# Patient Record
Sex: Female | Born: 1975 | Race: White | Hispanic: No | Marital: Single | Smoking: Never smoker
Health system: Southern US, Community
[De-identification: ages and names within clinical notes are randomized; demographics above are authoritative.]

## PROBLEM LIST (undated history)

## (undated) DIAGNOSIS — E7800 Pure hypercholesterolemia, unspecified: Secondary | ICD-10-CM

## (undated) HISTORY — DX: Pure hypercholesterolemia, unspecified: E78.00

## (undated) HISTORY — PX: WISDOM TOOTH EXTRACTION: SHX21

---

## 2009-09-04 ENCOUNTER — Other Ambulatory Visit: Admission: RE | Admit: 2009-09-04 | Discharge: 2009-09-04 | Payer: Self-pay | Admitting: Obstetrics and Gynecology

## 2009-09-04 ENCOUNTER — Encounter: Payer: Self-pay | Admitting: Women's Health

## 2009-09-04 ENCOUNTER — Ambulatory Visit: Payer: Self-pay | Admitting: Women's Health

## 2014-04-05 ENCOUNTER — Encounter: Payer: Self-pay | Admitting: Women's Health

## 2014-04-05 ENCOUNTER — Other Ambulatory Visit (HOSPITAL_COMMUNITY)
Admission: RE | Admit: 2014-04-05 | Discharge: 2014-04-05 | Disposition: A | Discharge status: Home / Self Care | Payer: 59 | Source: Ambulatory Visit | Attending: Gynecology | Admitting: Gynecology

## 2014-04-05 ENCOUNTER — Ambulatory Visit (INDEPENDENT_AMBULATORY_CARE_PROVIDER_SITE_OTHER): Payer: 59 | Admitting: Women's Health

## 2014-04-05 VITALS — BP 124/80 | Ht 68.0 in | Wt 216.0 lb

## 2014-04-05 DIAGNOSIS — Z01419 Encounter for gynecological examination (general) (routine) without abnormal findings: Secondary | ICD-10-CM | POA: Insufficient documentation

## 2014-04-05 DIAGNOSIS — Z113 Encounter for screening for infections with a predominantly sexual mode of transmission: Secondary | ICD-10-CM

## 2014-04-05 DIAGNOSIS — N926 Irregular menstruation, unspecified: Secondary | ICD-10-CM

## 2014-04-05 DIAGNOSIS — Z833 Family history of diabetes mellitus: Secondary | ICD-10-CM

## 2014-04-05 LAB — CBC WITH DIFFERENTIAL/PLATELET
BASOS ABS: 0 10*3/uL (ref 0.0–0.1)
Basophils Relative: 0 % (ref 0–1)
EOS PCT: 2 % (ref 0–5)
Eosinophils Absolute: 0.2 10*3/uL (ref 0.0–0.7)
HEMATOCRIT: 39 % (ref 36.0–46.0)
HEMOGLOBIN: 12.8 g/dL (ref 12.0–15.0)
LYMPHS ABS: 2.5 10*3/uL (ref 0.7–4.0)
LYMPHS PCT: 31 % (ref 12–46)
MCH: 26.6 pg (ref 26.0–34.0)
MCHC: 32.8 g/dL (ref 30.0–36.0)
MCV: 81.1 fL (ref 78.0–100.0)
MONO ABS: 0.6 10*3/uL (ref 0.1–1.0)
MONOS PCT: 7 % (ref 3–12)
Neutro Abs: 4.8 10*3/uL (ref 1.7–7.7)
Neutrophils Relative %: 60 % (ref 43–77)
Platelets: 360 10*3/uL (ref 150–400)
RBC: 4.81 MIL/uL (ref 3.87–5.11)
RDW: 14.4 % (ref 11.5–15.5)
WBC: 8 10*3/uL (ref 4.0–10.5)

## 2014-04-05 MED ORDER — NORGESTIMATE-ETH ESTRADIOL 0.25-35 MG-MCG PO TABS: 1.0000 | ORAL_TABLET | Freq: Every day | ORAL | Status: DC

## 2014-04-05 NOTE — Patient Instructions (Signed)

## 2014-04-05 NOTE — Progress Notes (Signed)
Wendy Barrera 09-11-1976 [id number]    History:    Presents for annual exam.  Irregular periods, 4 -5 per year. Always irregular cycles, normal TSH and prolactin, regular when on OC's. Last pap normal 2010. Not sexually active but has had sexual partner since last visit.  Past medical history, past surgical history, family history and social history were all reviewed and documented in the EPIC chart. Mother- multiple precancerous breast biopsies at 1560, tamoxifen for 5 years. Works for [employer].   ROS:  A  ROS was performed and pertinent positives and negatives are included.  Exam:  Filed Vitals:   04/05/14 1516  BP: 124/80    General appearance:  Normal, overweight, appears well Thyroid:  Symmetrical, normal in size, without palpable masses or nodularity. Respiratory  Auscultation:  Clear without wheezing or rhonchi Cardiovascular  Auscultation:  Regular rate, without rubs, murmurs or gallops  Edema/varicosities:  Not grossly evident Abdominal  Soft,nontender, without masses, guarding or rebound.  Liver/spleen:  No organomegaly noted  Hernia:  None appreciated  Skin  Inspection:  Grossly normal, scar from irritation on L hand   Breasts: Examined lying and sitting.     Right: Without masses, retractions, discharge or axillary adenopathy.     Left: Without masses, retractions, discharge or axillary adenopathy. Gentitourinary   Inguinal/mons:  Normal without inguinal adenopathy  External genitalia:  Normal  BUS/Urethra/Skene's glands:  Normal  Vagina:  Normal  Cervix:  Normal  Uterus:  normal in size, shape and contour.  Midline and mobile  Adnexa/parametria:     Rt: Without masses or tenderness.   Lt: Without masses or tenderness.  Anus and perineum: Normal  Assessment/Plan:  38 y.o.  for annual exam. SWF G0P0.   Irregular menses Obesity STD screen  Plan: Ortho-Cyclen prescription, proper use given and reviewed. Discussed risk of stroke and clots. Encouraged  condom use . SBE's, increase regular exercise and decrease calories for weight loss, calcium rich diet, MVI daily encouraged. CBC, glucose, UA, Pap, GC/Chlamydia, HIV, hep B, C., RPR.  Harrington Challengerancy J Cristabel Bicknell [facility] Center[facility]NP, 3:52 PM 04/05/2014

## 2014-04-06 LAB — URINALYSIS W MICROSCOPIC + REFLEX CULTURE
BILIRUBIN URINE: NEGATIVE
Bacteria, UA: NONE SEEN
CASTS: NONE SEEN
CRYSTALS: NONE SEEN
GLUCOSE, UA: NEGATIVE mg/dL
Hgb urine dipstick: NEGATIVE
Ketones, ur: NEGATIVE mg/dL
Nitrite: NEGATIVE
PH: 6.5 (ref 5.0–8.0)
Protein, ur: NEGATIVE mg/dL
SPECIFIC GRAVITY, URINE: 1.009 (ref 1.005–1.030)
SQUAMOUS EPITHELIAL / LPF: NONE SEEN
Urobilinogen, UA: 0.2 mg/dL (ref 0.0–1.0)

## 2014-04-06 LAB — HEPATITIS B SURFACE ANTIGEN: Hepatitis B Surface Ag: NEGATIVE

## 2014-04-06 LAB — RPR

## 2014-04-06 LAB — HIV ANTIBODY (ROUTINE TESTING W REFLEX): HIV: NONREACTIVE

## 2014-04-06 LAB — HEPATITIS C ANTIBODY: HCV Ab: NEGATIVE

## 2014-04-06 LAB — GLUCOSE, RANDOM: Glucose, Bld: 86 mg/dL (ref 70–99)

## 2014-04-09 LAB — URINE CULTURE: Colony Count: >=100000

## 2014-04-09 LAB — GC/CHLAMYDIA PROBE AMP
CT Probe RNA: NEGATIVE
GC Probe RNA: NEGATIVE

## 2014-04-10 ENCOUNTER — Other Ambulatory Visit: Payer: Self-pay | Admitting: Women's Health

## 2014-04-10 MED ORDER — NITROFURANTOIN MONOHYD MACRO 100 MG PO CAPS: 100.0000 mg | ORAL_CAPSULE | Freq: Two times a day (BID) | ORAL | Status: DC

## 2014-05-24 ENCOUNTER — Other Ambulatory Visit: Payer: Self-pay | Admitting: *Deleted

## 2014-05-24 DIAGNOSIS — N926 Irregular menstruation, unspecified: Secondary | ICD-10-CM

## 2014-05-24 MED ORDER — NORGESTIMATE-ETH ESTRADIOL 0.25-35 MG-MCG PO TABS: 1.0000 | ORAL_TABLET | Freq: Every day | ORAL | Status: DC

## 2014-05-24 NOTE — Telephone Encounter (Signed)
Pharmacy faxed refill request for BCP pt was seen on 03/2014

## 2015-06-11 ENCOUNTER — Encounter: Payer: Self-pay | Admitting: Women's Health

## 2015-06-11 ENCOUNTER — Ambulatory Visit (INDEPENDENT_AMBULATORY_CARE_PROVIDER_SITE_OTHER): Payer: 59 | Admitting: Women's Health

## 2015-06-11 VITALS — BP 120/82 | Ht 69.0 in | Wt 196.2 lb

## 2015-06-11 DIAGNOSIS — Z13220 Encounter for screening for lipoid disorders: Secondary | ICD-10-CM

## 2015-06-11 DIAGNOSIS — Z01419 Encounter for gynecological examination (general) (routine) without abnormal findings: Secondary | ICD-10-CM | POA: Diagnosis not present

## 2015-06-11 DIAGNOSIS — N926 Irregular menstruation, unspecified: Secondary | ICD-10-CM | POA: Diagnosis not present

## 2015-06-11 LAB — LIPID PANEL
Cholesterol: 224 mg/dL — ABNORMAL HIGH (ref 0–200)
HDL: 41 mg/dL — ABNORMAL LOW (ref >=46)
LDL Cholesterol: 125 mg/dL — ABNORMAL HIGH (ref 0–99)
Total CHOL/HDL Ratio: 5.5 Ratio
Triglycerides: 288 mg/dL — ABNORMAL HIGH (ref <150)
VLDL: 58 mg/dL — AB (ref 0–40)

## 2015-06-11 LAB — CBC WITH DIFFERENTIAL/PLATELET
BASOS ABS: 0.1 10*3/uL (ref 0.0–0.1)
Basophils Relative: 1 % (ref 0–1)
Eosinophils Absolute: 0.2 10*3/uL (ref 0.0–0.7)
Eosinophils Relative: 2 % (ref 0–5)
HCT: 40.4 % (ref 36.0–46.0)
HEMOGLOBIN: 13.3 g/dL (ref 12.0–15.0)
LYMPHS ABS: 2.2 10*3/uL (ref 0.7–4.0)
LYMPHS PCT: 27 % (ref 12–46)
MCH: 27.8 pg (ref 26.0–34.0)
MCHC: 32.9 g/dL (ref 30.0–36.0)
MCV: 84.3 fL (ref 78.0–100.0)
MONO ABS: 0.6 10*3/uL (ref 0.1–1.0)
MPV: 9.4 fL (ref 8.6–12.4)
Monocytes Relative: 7 % (ref 3–12)
Neutro Abs: 5.2 10*3/uL (ref 1.7–7.7)
Neutrophils Relative %: 63 % (ref 43–77)
PLATELETS: 360 10*3/uL (ref 150–400)
RBC: 4.79 MIL/uL (ref 3.87–5.11)
RDW: 13.7 % (ref 11.5–15.5)
WBC: 8.2 10*3/uL (ref 4.0–10.5)

## 2015-06-11 LAB — GLUCOSE, RANDOM: Glucose, Bld: 86 mg/dL (ref 70–99)

## 2015-06-11 MED ORDER — NORGESTIMATE-ETH ESTRADIOL 0.25-35 MG-MCG PO TABS: 1.0000 | ORAL_TABLET | Freq: Every day | ORAL | Status: DC

## 2015-06-11 NOTE — Patient Instructions (Signed)

## 2015-06-11 NOTE — Progress Notes (Signed)
Wendy Barrera 09-07-1976 [id number]    History:    Presents for annual exam.  Regular monthly cycle on Ortho-Cyclen. History of cycle every other month. Normal TSH and prolactin. Not sexually active greater than one year. Normal Pap history. Has lost 20 pounds in the past year with diet and exercise.  Past medical history, past surgical history, family history and social history were all reviewed and documented in the EPIC chart. Works for [employer]. Mother questionable precancerous breast cells on biopsy tamoxifen for 5 years. A. fib. Father recent cholecystectomy.  ROS:  A ROS was performed and pertinent positives and negatives are included.  Exam:  Filed Vitals:   06/11/15 1525  BP: 120/82    General appearance:  Normal Thyroid:  Symmetrical, normal in size, without palpable masses or nodularity. Respiratory  Auscultation:  Clear without wheezing or rhonchi Cardiovascular  Auscultation:  Regular rate, without rubs, murmurs or gallops  Edema/varicosities:  Not grossly evident Abdominal  Soft,nontender, without masses, guarding or rebound.  Liver/spleen:  No organomegaly noted  Hernia:  None appreciated  Skin  Inspection:  Grossly normal   Breasts: Examined lying and sitting.     Right: Without masses, retractions, discharge or axillary adenopathy.     Left: Without masses, retractions, discharge or axillary adenopathy. Gentitourinary   Inguinal/mons:  Normal without inguinal adenopathy  External genitalia:  Normal  BUS/Urethra/Skene's glands:  Normal  Vagina:  Normal  Cervix:  Normal  Uterus:   normal in size, shape and contour.  Midline and mobile  Adnexa/parametria:     Rt: Without masses or tenderness.   Lt: Without masses or tenderness.  Anus and perineum: Normal  Digital rectal exam: Normal sphincter tone without palpated masses or tenderness  Assessment/Plan:  38 y.o.SWF G0  for annual exam.    Regular monthly cycle on Ortho-Cyclen/history of irregular  cycles  Plan: Congratulated on a 20 pound weight loss is continuing to try to increase regular exercise and decrease carbs need a healthy diet. SBE's, annual screening mammogram at 40, calcium rich diet, MVI daily.  Ortho-Cyclen prescription, proper use, risk for blood clots and strokes reviewed. Condoms encouraged if sexually active. CBC, glucose, lipid panel, UA, Pap normal 2015, new screening guidelines reviewed.   Harrington Challenger [facility], 3:57 PM 06/11/2015

## 2015-06-13 ENCOUNTER — Other Ambulatory Visit: Payer: Self-pay | Admitting: Women's Health

## 2015-06-13 DIAGNOSIS — E782 Mixed hyperlipidemia: Secondary | ICD-10-CM

## 2015-06-13 DIAGNOSIS — E7800 Pure hypercholesterolemia, unspecified: Secondary | ICD-10-CM

## 2015-10-13 ENCOUNTER — Other Ambulatory Visit: Payer: 59

## 2015-10-14 ENCOUNTER — Other Ambulatory Visit: Payer: 59

## 2015-10-14 DIAGNOSIS — E782 Mixed hyperlipidemia: Secondary | ICD-10-CM

## 2015-10-14 DIAGNOSIS — E7800 Pure hypercholesterolemia, unspecified: Secondary | ICD-10-CM

## 2015-10-14 LAB — LIPID PANEL
CHOL/HDL RATIO: 5.5 ratio — AB (ref <=5.0)
Cholesterol: 177 mg/dL (ref 125–200)
HDL: 32 mg/dL — AB (ref >=46)
LDL CALC: 70 mg/dL (ref <130)
Triglycerides: 377 mg/dL — ABNORMAL HIGH (ref <150)
VLDL: 75 mg/dL — AB (ref <30)

## 2015-10-16 ENCOUNTER — Encounter: Payer: Self-pay | Admitting: Women's Health

## 2016-06-11 ENCOUNTER — Encounter: Payer: 59 | Admitting: Women's Health

## 2016-07-02 ENCOUNTER — Encounter: Payer: Self-pay | Admitting: Women's Health

## 2016-07-02 ENCOUNTER — Ambulatory Visit (INDEPENDENT_AMBULATORY_CARE_PROVIDER_SITE_OTHER): Payer: 59 | Admitting: Women's Health

## 2016-07-02 VITALS — BP 118/76 | Ht 68.0 in | Wt 185.4 lb

## 2016-07-02 DIAGNOSIS — Z1151 Encounter for screening for human papillomavirus (HPV): Secondary | ICD-10-CM

## 2016-07-02 DIAGNOSIS — N926 Irregular menstruation, unspecified: Secondary | ICD-10-CM

## 2016-07-02 DIAGNOSIS — E7800 Pure hypercholesterolemia, unspecified: Secondary | ICD-10-CM | POA: Diagnosis not present

## 2016-07-02 DIAGNOSIS — Z01419 Encounter for gynecological examination (general) (routine) without abnormal findings: Secondary | ICD-10-CM | POA: Diagnosis not present

## 2016-07-02 MED ORDER — NORGESTIMATE-ETH ESTRADIOL 0.25-35 MG-MCG PO TABS
1.0000 | ORAL_TABLET | Freq: Every day | ORAL | Status: DC
Start: 2016-07-02 — End: 2016-08-02

## 2016-07-02 NOTE — Progress Notes (Signed)
Wendy Barrera [birth year] [id number]    History:    Presents for annual exam.  Monthly cycle on Ortho-Cyclen without complaint. Not sexually active greater than a year. Normal Pap history. Elevated lipid panel last year, primary care  started on Crestor lipid panel much better. Mother history of questionable precancerous cells on breast biopsies was on tamoxifen for 5 years doing well.  Past medical history, past surgical history, family history and social history were all reviewed and documented in the EPIC chart. [place] at [employer]. Father A. fib.  ROS:  A ROS was performed and pertinent positives and negatives are included.  Exam:  Filed Vitals:   07/02/16 0753  BP: 118/76    General appearance:  Normal Thyroid:  Symmetrical, normal in size, without palpable masses or nodularity. Respiratory  Auscultation:  Clear without wheezing or rhonchi Cardiovascular  Auscultation:  Regular rate, without rubs, murmurs or gallops  Edema/varicosities:  Not grossly evident Abdominal  Soft,nontender, without masses, guarding or rebound.  Liver/spleen:  No organomegaly noted  Hernia:  None appreciated  Skin  Inspection:  Grossly normal   Breasts: Examined lying and sitting.     Right: Without masses, retractions, discharge or axillary adenopathy.     Left: Without masses, retractions, discharge or axillary adenopathy. Gentitourinary   Inguinal/mons:  Normal without inguinal adenopathy  External genitalia:  Normal  BUS/Urethra/Skene's glands:  Normal  Vagina:  Normal  Cervix:  Normal  Uterus:  normal in size, shape and contour.  Midline and mobile  Adnexa/parametria:     Rt: Without masses or tenderness.   Lt: Without masses or tenderness.  Anus and perineum: Normal  Digital rectal exam: Normal sphincter tone without palpated masses or tenderness  Assessment/Plan:  40 y.o. S WF G0  for annual exam with no complaints.  Monthly cycle on  Ortho-Cyclen Hypercholesteremia-primary care manages labs and meds  Plan: Ortho-Cyclen prescription, proper use, slight risk for blood clots and strokes reviewed. Condoms encouraged if sexually active. Continue healthy lifestyle has lost 11 pounds since last year. Encouraged exercise, calcium rich diet, MVI daily. Reviewed importance of stopping Crestor prior to trying to conceive. UA, Pap with HR HPV typing, new screening guidelines reviewed.  Harrington ChallengerYOUNG,NANCY J [facility]WHNP, 8:26 AM 07/02/2016

## 2016-07-02 NOTE — Patient Instructions (Signed)
Health Maintenance, Female Adopting a healthy lifestyle and getting preventive care can go a long way to promote health and wellness. Talk with your health care provider about what schedule of regular examinations is right for you. This is a good chance for you to check in with your provider about disease prevention and staying healthy. In between checkups, there are plenty of things you can do on your own. Experts have done a lot of research about which lifestyle changes and preventive measures are most likely to keep you healthy. Ask your health care provider for more information. WEIGHT AND DIET  Eat a healthy diet  Be sure to include plenty of vegetables, fruits, low-fat dairy products, and lean protein.  Do not eat a lot of foods high in solid fats, added sugars, or salt.  Get regular exercise. This is one of the most important things you can do for your health.  Most adults should exercise for at least 150 minutes each week. The exercise should increase your heart rate and make you sweat (moderate-intensity exercise).  Most adults should also do strengthening exercises at least twice a week. This is in addition to the moderate-intensity exercise.  Maintain a healthy weight  Body mass index (BMI) is a measurement that can be used to identify possible weight problems. It estimates body fat based on height and weight. Your health care provider can help determine your BMI and help you achieve or maintain a healthy weight.  For females 20 years of age and older:   A BMI below 18.5 is considered underweight.  A BMI of 18.5 to 24.9 is normal.  A BMI of 25 to 29.9 is considered overweight.  A BMI of 30 and above is considered obese.  Watch levels of cholesterol and blood lipids  You should start having your blood tested for lipids and cholesterol at 39 years of age, then have this test every 5 years.  You may need to have your cholesterol levels checked more often if:  Your lipid  or cholesterol levels are high.  You are older than 39 years of age.  You are at high risk for heart disease.  CANCER SCREENING   Lung Cancer  Lung cancer screening is recommended for adults 55-80 years old who are at high risk for lung cancer because of a history of smoking.  A yearly low-dose CT scan of the lungs is recommended for people who:  Currently smoke.  Have quit within the past 15 years.  Have at least a 30-pack-year history of smoking. A pack year is smoking an average of one pack of cigarettes a day for 1 year.  Yearly screening should continue until it has been 15 years since you quit.  Yearly screening should stop if you develop a health problem that would prevent you from having lung cancer treatment.  Breast Cancer  Practice breast self-awareness. This means understanding how your breasts normally appear and feel.  It also means doing regular breast self-exams. Let your health care provider know about any changes, no matter how small.  If you are in your 20s or 30s, you should have a clinical breast exam (CBE) by a health care provider every 1-3 years as part of a regular health exam.  If you are 40 or older, have a CBE every year. Also consider having a breast X-ray (mammogram) every year.  If you have a family history of breast cancer, talk to your health care provider about genetic screening.  If you   are at high risk for breast cancer, talk to your health care provider about having an MRI and a mammogram every year.  Breast cancer gene (BRCA) assessment is recommended for women who have family members with BRCA-related cancers. BRCA-related cancers include:  Breast.  Ovarian.  Tubal.  Peritoneal cancers.  Results of the assessment will determine the need for genetic counseling and BRCA1 and BRCA2 testing. Cervical Cancer Your health care provider may recommend that you be screened regularly for cancer of the pelvic organs (ovaries, uterus, and  vagina). This screening involves a pelvic examination, including checking for microscopic changes to the surface of your cervix (Pap test). You may be encouraged to have this screening done every 3 years, beginning at age 21.  For women ages 30-65, health care providers may recommend pelvic exams and Pap testing every 3 years, or they may recommend the Pap and pelvic exam, combined with testing for human papilloma virus (HPV), every 5 years. Some types of HPV increase your risk of cervical cancer. Testing for HPV may also be done on women of any age with unclear Pap test results.  Other health care providers may not recommend any screening for nonpregnant women who are considered low risk for pelvic cancer and who do not have symptoms. Ask your health care provider if a screening pelvic exam is right for you.  If you have had past treatment for cervical cancer or a condition that could lead to cancer, you need Pap tests and screening for cancer for at least 20 years after your treatment. If Pap tests have been discontinued, your risk factors (such as having a new sexual partner) need to be reassessed to determine if screening should resume. Some women have medical problems that increase the chance of getting cervical cancer. In these cases, your health care provider may recommend more frequent screening and Pap tests. Colorectal Cancer  This type of cancer can be detected and often prevented.  Routine colorectal cancer screening usually begins at 39 years of age and continues through 39 years of age.  Your health care provider may recommend screening at an earlier age if you have risk factors for colon cancer.  Your health care provider may also recommend using home test kits to check for hidden blood in the stool.  A small camera at the end of a tube can be used to examine your colon directly (sigmoidoscopy or colonoscopy). This is done to check for the earliest forms of colorectal  cancer.  Routine screening usually begins at age 50.  Direct examination of the colon should be repeated every 5-10 years through 39 years of age. However, you may need to be screened more often if early forms of precancerous polyps or small growths are found. Skin Cancer  Check your skin from head to toe regularly.  Tell your health care provider about any new moles or changes in moles, especially if there is a change in a mole's shape or color.  Also tell your health care provider if you have a mole that is larger than the size of a pencil eraser.  Always use sunscreen. Apply sunscreen liberally and repeatedly throughout the day.  Protect yourself by wearing long sleeves, pants, a wide-brimmed hat, and sunglasses whenever you are outside. HEART DISEASE, DIABETES, AND HIGH BLOOD PRESSURE   High blood pressure causes heart disease and increases the risk of stroke. High blood pressure is more likely to develop in:  People who have blood pressure in the high end   of the normal range (130-139/85-89 mm Hg).  People who are overweight or obese.  People who are African American.  If you are 38-23 years of age, have your blood pressure checked every 3-5 years. If you are 61 years of age or older, have your blood pressure checked every year. You should have your blood pressure measured twice--once when you are at a hospital or clinic, and once when you are not at a hospital or clinic. Record the average of the two measurements. To check your blood pressure when you are not at a hospital or clinic, you can use:  An automated blood pressure machine at a pharmacy.  A home blood pressure monitor.  If you are between 45 years and 39 years old, ask your health care provider if you should take aspirin to prevent strokes.  Have regular diabetes screenings. This involves taking a blood sample to check your fasting blood sugar level.  If you are at a normal weight and have a low risk for diabetes,  have this test once every three years after 39 years of age.  If you are overweight and have a high risk for diabetes, consider being tested at a younger age or more often. PREVENTING INFECTION  Hepatitis B  If you have a higher risk for hepatitis B, you should be screened for this virus. You are considered at high risk for hepatitis B if:  You were born in a country where hepatitis B is common. Ask your health care provider which countries are considered high risk.  Your parents were born in a high-risk country, and you have not been immunized against hepatitis B (hepatitis B vaccine).  You have HIV or AIDS.  You use needles to inject street drugs.  You live with someone who has hepatitis B.  You have had sex with someone who has hepatitis B.  You get hemodialysis treatment.  You take certain medicines for conditions, including cancer, organ transplantation, and autoimmune conditions. Hepatitis C  Blood testing is recommended for:  Everyone born from [birth year] through 1964.  Anyone with known risk factors for hepatitis C. Sexually transmitted infections (STIs)  You should be screened for sexually transmitted infections (STIs) including gonorrhea and chlamydia if:  You are sexually active and are younger than 39 years of age.  You are older than 39 years of age and your health care provider tells you that you are at risk for this type of infection.  Your sexual activity has changed since you were last screened and you are at an increased risk for chlamydia or gonorrhea. Ask your health care provider if you are at risk.  If you do not have HIV, but are at risk, it may be recommended that you take a prescription medicine daily to prevent HIV infection. This is called pre-exposure prophylaxis (PrEP). You are considered at risk if:  You are sexually active and do not regularly use condoms or know the HIV status of your partner(s).  You take drugs by injection.  You are sexually  active with a partner who has HIV. Talk with your health care provider about whether you are at high risk of being infected with HIV. If you choose to begin PrEP, you should first be tested for HIV. You should then be tested every 3 months for as long as you are taking PrEP.  PREGNANCY   If you are premenopausal and you may become pregnant, ask your health care provider about preconception counseling.  If you may  become pregnant, take 400 to 800 micrograms (mcg) of folic acid every day.  If you want to prevent pregnancy, talk to your health care provider about birth control (contraception). OSTEOPOROSIS AND MENOPAUSE   Osteoporosis is a disease in which the bones lose minerals and strength with aging. This can result in serious bone fractures. Your risk for osteoporosis can be identified using a bone density scan.  If you are 61 years of age or older, or if you are at risk for osteoporosis and fractures, ask your health care provider if you should be screened.  Ask your health care provider whether you should take a calcium or vitamin D supplement to lower your risk for osteoporosis.  Menopause may have certain physical symptoms and risks.  Hormone replacement therapy may reduce some of these symptoms and risks. Talk to your health care provider about whether hormone replacement therapy is right for you.  HOME CARE INSTRUCTIONS   Schedule regular health, dental, and eye exams.  Stay current with your immunizations.   Do not use any tobacco products including cigarettes, chewing tobacco, or electronic cigarettes.  If you are pregnant, do not drink alcohol.  If you are breastfeeding, limit how much and how often you drink alcohol.  Limit alcohol intake to no more than 1 drink per day for nonpregnant women. One drink equals 12 ounces of beer, 5 ounces of wine, or 1 ounces of hard liquor.  Do not use street drugs.  Do not share needles.  Ask your health care provider for help if  you need support or information about quitting drugs.  Tell your health care provider if you often feel depressed.  Tell your health care provider if you have ever been abused or do not feel safe at home.   This information is not intended to replace advice given to you by your health care provider. Make sure you discuss any questions you have with your health care provider.   Document Released: 06/28/2011 Document Revised: 01/03/2015 Document Reviewed: 11/14/2013 Elsevier Interactive Patient Education [employer].

## 2016-07-02 NOTE — Addendum Note (Signed)
Addended by: Richardson ChiquitoWILKINSON, KARI S on: 07/02/2016 08:56 AM   Modules accepted: Orders

## 2016-07-07 LAB — PAP IG AND HPV HIGH-RISK: HPV DNA HIGH RISK: NOT DETECTED

## 2016-08-01 ENCOUNTER — Other Ambulatory Visit: Payer: Self-pay | Admitting: Women's Health

## 2016-08-01 DIAGNOSIS — N926 Irregular menstruation, unspecified: Secondary | ICD-10-CM

## 2017-05-11 ENCOUNTER — Encounter: Payer: Self-pay | Admitting: Gynecology

## 2017-07-06 ENCOUNTER — Encounter: Payer: 59 | Admitting: Women's Health

## 2017-07-11 ENCOUNTER — Encounter: Payer: Self-pay | Admitting: Women's Health

## 2017-07-11 ENCOUNTER — Ambulatory Visit (INDEPENDENT_AMBULATORY_CARE_PROVIDER_SITE_OTHER): Payer: BLUE CROSS/BLUE SHIELD | Admitting: Women's Health

## 2017-07-11 VITALS — BP 118/78 | Ht 69.0 in | Wt 209.0 lb

## 2017-07-11 DIAGNOSIS — N926 Irregular menstruation, unspecified: Secondary | ICD-10-CM | POA: Diagnosis not present

## 2017-07-11 DIAGNOSIS — Z01419 Encounter for gynecological examination (general) (routine) without abnormal findings: Secondary | ICD-10-CM

## 2017-07-11 MED ORDER — NORGESTIMATE-ETH ESTRADIOL 0.25-35 MG-MCG PO TABS: 1.0000 | ORAL_TABLET | Freq: Every day | ORAL | 4 refills | Status: DC

## 2017-07-11 NOTE — Patient Instructions (Signed)
Breast center  [phone]  Mammogram  Health Maintenance, Female Adopting a healthy lifestyle and getting preventive care can go a long way to promote health and wellness. Talk with your health care provider about what schedule of regular examinations is right for you. This is a good chance for you to check in with your provider about disease prevention and staying healthy. In between checkups, there are plenty of things you can do on your own. Experts have done a lot of research about which lifestyle changes and preventive measures are most likely to keep you healthy. Ask your health care provider for more information. Weight and diet Eat a healthy diet  Be sure to include plenty of vegetables, fruits, low-fat dairy products, and lean protein.  Do not eat a lot of foods high in solid fats, added sugars, or salt.  Get regular exercise. This is one of the most important things you can do for your health. ? Most adults should exercise for at least 150 minutes each week. The exercise should increase your heart rate and make you sweat (moderate-intensity exercise). ? Most adults should also do strengthening exercises at least twice a week. This is in addition to the moderate-intensity exercise.  Maintain a healthy weight  Body mass index (BMI) is a measurement that can be used to identify possible weight problems. It estimates body fat based on height and weight. Your health care provider can help determine your BMI and help you achieve or maintain a healthy weight.  For females 20 years of age and older: ? A BMI below 18.5 is considered underweight. ? A BMI of 18.5 to 24.9 is normal. ? A BMI of 25 to 29.9 is considered overweight. ? A BMI of 30 and above is considered obese.  Watch levels of cholesterol and blood lipids  You should start having your blood tested for lipids and cholesterol at 40 years of age, then have this test every 5 years.  You may need to have your cholesterol levels  checked more often if: ? Your lipid or cholesterol levels are high. ? You are older than 40 years of age. ? You are at high risk for heart disease.  Cancer screening Lung Cancer  Lung cancer screening is recommended for adults 55-80 years old who are at high risk for lung cancer because of a history of smoking.  A yearly low-dose CT scan of the lungs is recommended for people who: ? Currently smoke. ? Have quit within the past 15 years. ? Have at least a 30-pack-year history of smoking. A pack year is smoking an average of one pack of cigarettes a day for 1 year.  Yearly screening should continue until it has been 15 years since you quit.  Yearly screening should stop if you develop a health problem that would prevent you from having lung cancer treatment.  Breast Cancer  Practice breast self-awareness. This means understanding how your breasts normally appear and feel.  It also means doing regular breast self-exams. Let your health care provider know about any changes, no matter how small.  If you are in your 20s or 30s, you should have a clinical breast exam (CBE) by a health care provider every 1-3 years as part of a regular health exam.  If you are 40 or older, have a CBE every year. Also consider having a breast X-ray (mammogram) every year.  If you have a family history of breast cancer, talk to your health care provider about genetic screening.    If you are at high risk for breast cancer, talk to your health care provider about having an MRI and a mammogram every year.  Breast cancer gene (BRCA) assessment is recommended for women who have family members with BRCA-related cancers. BRCA-related cancers include: ? Breast. ? Ovarian. ? Tubal. ? Peritoneal cancers.  Results of the assessment will determine the need for genetic counseling and BRCA1 and BRCA2 testing.  Cervical Cancer Your health care provider may recommend that you be screened regularly for cancer of the  pelvic organs (ovaries, uterus, and vagina). This screening involves a pelvic examination, including checking for microscopic changes to the surface of your cervix (Pap test). You may be encouraged to have this screening done every 3 years, beginning at age 71.  For women ages 82-65, health care providers may recommend pelvic exams and Pap testing every 3 years, or they may recommend the Pap and pelvic exam, combined with testing for human papilloma virus (HPV), every 5 years. Some types of HPV increase your risk of cervical cancer. Testing for HPV may also be done on women of any age with unclear Pap test results.  Other health care providers may not recommend any screening for nonpregnant women who are considered low risk for pelvic cancer and who do not have symptoms. Ask your health care provider if a screening pelvic exam is right for you.  If you have had past treatment for cervical cancer or a condition that could lead to cancer, you need Pap tests and screening for cancer for at least 20 years after your treatment. If Pap tests have been discontinued, your risk factors (such as having a new sexual partner) need to be reassessed to determine if screening should resume. Some women have medical problems that increase the chance of getting cervical cancer. In these cases, your health care provider may recommend more frequent screening and Pap tests.  Colorectal Cancer  This type of cancer can be detected and often prevented.  Routine colorectal cancer screening usually begins at 40 years of age and continues through 40 years of age.  Your health care provider may recommend screening at an earlier age if you have risk factors for colon cancer.  Your health care provider may also recommend using home test kits to check for hidden blood in the stool.  A small camera at the end of a tube can be used to examine your colon directly (sigmoidoscopy or colonoscopy). This is done to check for the  earliest forms of colorectal cancer.  Routine screening usually begins at age 15.  Direct examination of the colon should be repeated every 5-10 years through 40 years of age. However, you may need to be screened more often if early forms of precancerous polyps or small growths are found.  Skin Cancer  Check your skin from head to toe regularly.  Tell your health care provider about any new moles or changes in moles, especially if there is a change in a mole's shape or color.  Also tell your health care provider if you have a mole that is larger than the size of a pencil eraser.  Always use sunscreen. Apply sunscreen liberally and repeatedly throughout the day.  Protect yourself by wearing long sleeves, pants, a wide-brimmed hat, and sunglasses whenever you are outside.  Heart disease, diabetes, and high blood pressure  High blood pressure causes heart disease and increases the risk of stroke. High blood pressure is more likely to develop in: ? People who have blood  pressure in the high end of the normal range (130-139/85-89 mm Hg). ? People who are overweight or obese. ? People who are African American.  If you are 56-87 years of age, have your blood pressure checked every 3-5 years. If you are 74 years of age or older, have your blood pressure checked every year. You should have your blood pressure measured twice-once when you are at a hospital or clinic, and once when you are not at a hospital or clinic. Record the average of the two measurements. To check your blood pressure when you are not at a hospital or clinic, you can use: ? An automated blood pressure machine at a pharmacy. ? A home blood pressure monitor.  If you are between 69 years and 79 years old, ask your health care provider if you should take aspirin to prevent strokes.  Have regular diabetes screenings. This involves taking a blood sample to check your fasting blood sugar level. ? If you are at a normal weight and  have a low risk for diabetes, have this test once every three years after 40 years of age. ? If you are overweight and have a high risk for diabetes, consider being tested at a younger age or more often. Preventing infection Hepatitis B  If you have a higher risk for hepatitis B, you should be screened for this virus. You are considered at high risk for hepatitis B if: ? You were born in a country where hepatitis B is common. Ask your health care provider which countries are considered high risk. ? Your parents were born in a high-risk country, and you have not been immunized against hepatitis B (hepatitis B vaccine). ? You have HIV or AIDS. ? You use needles to inject street drugs. ? You live with someone who has hepatitis B. ? You have had sex with someone who has hepatitis B. ? You get hemodialysis treatment. ? You take certain medicines for conditions, including cancer, organ transplantation, and autoimmune conditions.  Hepatitis C  Blood testing is recommended for: ? Everyone born from [birth year] through 1964. ? Anyone with known risk factors for hepatitis C.  Sexually transmitted infections (STIs)  You should be screened for sexually transmitted infections (STIs) including gonorrhea and chlamydia if: ? You are sexually active and are younger than 40 years of age. ? You are older than 40 years of age and your health care provider tells you that you are at risk for this type of infection. ? Your sexual activity has changed since you were last screened and you are at an increased risk for chlamydia or gonorrhea. Ask your health care provider if you are at risk.  If you do not have HIV, but are at risk, it may be recommended that you take a prescription medicine daily to prevent HIV infection. This is called pre-exposure prophylaxis (PrEP). You are considered at risk if: ? You are sexually active and do not regularly use condoms or know the HIV status of your partner(s). ? You take drugs by  injection. ? You are sexually active with a partner who has HIV.  Talk with your health care provider about whether you are at high risk of being infected with HIV. If you choose to begin PrEP, you should first be tested for HIV. You should then be tested every 3 months for as long as you are taking PrEP. Pregnancy  If you are premenopausal and you may become pregnant, ask your health care provider about preconception  counseling.  If you may become pregnant, take 400 to 800 micrograms (mcg) of folic acid every day.  If you want to prevent pregnancy, talk to your health care provider about birth control (contraception). Osteoporosis and menopause  Osteoporosis is a disease in which the bones lose minerals and strength with aging. This can result in serious bone fractures. Your risk for osteoporosis can be identified using a bone density scan.  If you are 54 years of age or older, or if you are at risk for osteoporosis and fractures, ask your health care provider if you should be screened.  Ask your health care provider whether you should take a calcium or vitamin D supplement to lower your risk for osteoporosis.  Menopause may have certain physical symptoms and risks.  Hormone replacement therapy may reduce some of these symptoms and risks. Talk to your health care provider about whether hormone replacement therapy is right for you. Follow these instructions at home:  Schedule regular health, dental, and eye exams.  Stay current with your immunizations.  Do not use any tobacco products including cigarettes, chewing tobacco, or electronic cigarettes.  If you are pregnant, do not drink alcohol.  If you are breastfeeding, limit how much and how often you drink alcohol.  Limit alcohol intake to no more than 1 drink per day for nonpregnant women. One drink equals 12 ounces of beer, 5 ounces of wine, or 1 ounces of hard liquor.  Do not use street drugs.  Do not share needles.  Ask  your health care provider for help if you need support or information about quitting drugs.  Tell your health care provider if you often feel depressed.  Tell your health care provider if you have ever been abused or do not feel safe at home. This information is not intended to replace advice given to you by your health care provider. Make sure you discuss any questions you have with your health care provider. Document Released: 06/28/2011 Document Revised: 05/20/2016 Document Reviewed: 09/16/2015 Elsevier Interactive Patient Education  [employer].

## 2017-07-11 NOTE — Progress Notes (Signed)
Wendy Barrera 06/24/1976 [id number]    History:    Presents for annual exam.  Monthly cycle on Sprintec without complaint. Not sexually active-years, denies need for STD screening. Normal Pap history. Elevated cholesterol primary care manages. Mother history of precancerous breast cysts completed 5 years of tamoxifen doing well.  Past medical history, past surgical history, family history and social history were all reviewed and documented in the EPIC chart. Works at [employer]. [place] Father A. fib. and hypercholesterolemia  ROS:  A ROS was performed and pertinent positives and negatives are included.  Exam:  Vitals:   07/11/17 1141  BP: 118/78  Weight: 209 lb (94.8 kg)  Height: 5\' 9"  (1.753 m)   Body mass index is 30.86 kg/m.   General appearance:  Normal Thyroid:  Symmetrical, normal in size, without palpable masses or nodularity. Respiratory  Auscultation:  Clear without wheezing or rhonchi Cardiovascular  Auscultation:  Regular rate, without rubs, murmurs or gallops  Edema/varicosities:  Not grossly evident Abdominal  Soft,nontender, without masses, guarding or rebound.  Liver/spleen:  No organomegaly noted  Hernia:  None appreciated  Skin  Inspection:  Grossly normal   Breasts: Examined lying and sitting.     Right: Without masses, retractions, discharge or axillary adenopathy.     Left: Without masses, retractions, discharge or axillary adenopathy. Gentitourinary   Inguinal/mons:  Normal without inguinal adenopathy  External genitalia:  Normal  BUS/Urethra/Skene's glands:  Normal  Vagina:  Normal  Cervix:  Normal  Uterus:   normal in size, shape and contour.  Midline and mobile  Adnexa/parametria:     Rt: Without masses or tenderness.   Lt: Without masses or tenderness.  Anus and perineum: Normal  Digital rectal exam: Normal sphincter tone without palpated masses or tenderness  Assessment/Plan:  41 y.o. S WF G0 for annual exam with no  complaints.  Regular monthly cycle on Sprintec Hypercholesterolemia-primary care manages labs and meds Obesity  Plan: Sprintec prescription, proper use, slight risk for blood clots and strokes reviewed. Condoms encouraged if sexually active. SBE's, annual screening mammogram, breast center information given instructed to schedule screening. Increase exercise and decrease calories, currently working with a trainer twice weekly. Calcium rich diet, MVI daily encouraged. Pap normal 2017, new screening guidelines reviewed.    Harrington Challengerancy J Young [facility] HospitalWHNP, 12:34 PM 07/11/2017

## 2017-10-04 ENCOUNTER — Other Ambulatory Visit: Payer: Self-pay | Admitting: Women's Health

## 2017-10-04 DIAGNOSIS — Z1239 Encounter for other screening for malignant neoplasm of breast: Secondary | ICD-10-CM

## 2017-10-25 ENCOUNTER — Ambulatory Visit
Admission: RE | Admit: 2017-10-25 | Discharge: 2017-10-25 | Disposition: A | Discharge status: Home / Self Care | Payer: BLUE CROSS/BLUE SHIELD | Source: Ambulatory Visit | Attending: Women's Health | Admitting: Women's Health

## 2017-10-25 ENCOUNTER — Encounter: Payer: Self-pay | Admitting: Women's Health

## 2017-10-25 DIAGNOSIS — Z1239 Encounter for other screening for malignant neoplasm of breast: Secondary | ICD-10-CM

## 2018-07-12 ENCOUNTER — Ambulatory Visit (INDEPENDENT_AMBULATORY_CARE_PROVIDER_SITE_OTHER): Payer: BLUE CROSS/BLUE SHIELD | Admitting: Women's Health

## 2018-07-12 ENCOUNTER — Encounter: Payer: Self-pay | Admitting: Women's Health

## 2018-07-12 VITALS — BP 118/76 | Ht 68.0 in | Wt 216.0 lb

## 2018-07-12 DIAGNOSIS — Z01419 Encounter for gynecological examination (general) (routine) without abnormal findings: Secondary | ICD-10-CM | POA: Diagnosis not present

## 2018-07-12 DIAGNOSIS — N926 Irregular menstruation, unspecified: Secondary | ICD-10-CM

## 2018-07-12 MED ORDER — NORETHINDRONE ACET-ETHINYL EST 1-20 MG-MCG PO TABS: 1.0000 | ORAL_TABLET | Freq: Every day | ORAL | 4 refills | Status: DC

## 2018-07-12 NOTE — Patient Instructions (Signed)
Fat and Cholesterol Restricted Diet Getting too much fat and cholesterol in your diet may cause health problems. Following this diet helps keep your fat and cholesterol at normal levels. This can keep you from getting sick. What types of fat should I choose?  Choose monosaturated and polyunsaturated fats. These are found in foods such as olive oil, canola oil, flaxseeds, walnuts, almonds, and seeds.  Eat more omega-3 fats. Good choices include salmon, mackerel, sardines, tuna, flaxseed oil, and ground flaxseeds.  Limit saturated fats. These are in animal products such as meats, butter, and cream. They can also be in plant products such as palm oil, palm kernel oil, and coconut oil.  Avoid foods with partially hydrogenated oils in them. These contain trans fats. Examples of foods that have trans fats are stick margarine, some tub margarines, cookies, crackers, and other baked goods. What general guidelines do I need to follow?  Check food labels. Look for the words "trans fat" and "saturated fat."  When preparing a meal: ? Fill half of your plate with vegetables and green salads. ? Fill one fourth of your plate with whole grains. Look for the word "whole" as the first word in the ingredient list. ? Fill one fourth of your plate with lean protein foods.  Eat more foods that have fiber, like apples, carrots, beans, peas, and barley.  Eat more home-cooked foods. Eat less at restaurants and buffets.  Limit or avoid alcohol.  Limit foods high in starch and sugar.  Limit fried foods.  Cook foods without frying them. Baking, boiling, grilling, and broiling are all great options.  Lose weight if you are overweight. Losing even a small amount of weight can help your overall health. It can also help prevent diseases such as diabetes and heart disease. What foods can I eat? Grains Whole grains, such as whole wheat or whole grain breads, crackers, cereals, and pasta. Unsweetened oatmeal,  bulgur, barley, quinoa, or brown rice. Corn or whole wheat flour tortillas. Vegetables Fresh or frozen vegetables (raw, steamed, roasted, or grilled). Green salads. Fruits All fresh, canned (in natural juice), or frozen fruits. Meat and Other Protein Products Ground beef (85% or leaner), grass-fed beef, or beef trimmed of fat. Skinless chicken or [place]. Ground chicken or [place]. Pork trimmed of fat. All fish and seafood. Eggs. Dried beans, peas, or lentils. Unsalted nuts or seeds. Unsalted canned or dry beans. Dairy Low-fat dairy products, such as skim or 1% milk, 2% or reduced-fat cheeses, low-fat ricotta or cottage cheese, or plain low-fat yogurt. Fats and Oils Tub margarines without trans fats. Light or reduced-fat mayonnaise and salad dressings. Avocado. Olive, canola, sesame, or safflower oils. Natural peanut or almond butter (choose ones without added sugar and oil). The items listed above may not be a complete list of recommended foods or beverages. Contact your dietitian for more options. What foods are not recommended? Grains White bread. White pasta. White rice. Cornbread. Bagels, pastries, and croissants. Crackers that contain trans fat. Vegetables White potatoes. Corn. Creamed or fried vegetables. Vegetables in a cheese sauce. Fruits Dried fruits. Canned fruit in light or heavy syrup. Fruit juice. Meat and Other Protein Products Fatty cuts of meat. Ribs, chicken wings, bacon, sausage, bologna, salami, chitterlings, fatback, hot dogs, bratwurst, and packaged luncheon meats. Liver and organ meats. Dairy Whole or 2% milk, cream, half-and-half, and cream cheese. Whole milk cheeses. Whole-fat or sweetened yogurt. Full-fat cheeses. Nondairy creamers and whipped toppings. Processed cheese, cheese spreads, or cheese curds. Sweets and Desserts Corn  syrup, sugars, honey, and molasses. Candy. Jam and jelly. Syrup. Sweetened cereals. Cookies, pies, cakes, donuts, muffins, and ice  cream. Fats and Oils Butter, stick margarine, lard, shortening, ghee, or bacon fat. Coconut, palm kernel, or palm oils. Beverages Alcohol. Sweetened drinks (such as sodas, lemonade, and fruit drinks or punches). The items listed above may not be a complete list of foods and beverages to avoid. Contact your dietitian for more information. This information is not intended to replace advice given to you by your health care provider. Make sure you discuss any questions you have with your health care provider. Document Released: 06/13/2012 Document Revised: 08/19/2016 Document Reviewed: 03/14/2014 Elsevier Interactive Patient Education  2018 [place] Maintenance, Female Adopting a healthy lifestyle and getting preventive care can go a long way to promote health and wellness. Talk with your health care provider about what schedule of regular examinations is right for you. This is a good chance for you to check in with your provider about disease prevention and staying healthy. In between checkups, there are plenty of things you can do on your own. Experts have done a lot of research about which lifestyle changes and preventive measures are most likely to keep you healthy. Ask your health care provider for more information. Weight and diet Eat a healthy diet  Be sure to include plenty of vegetables, fruits, low-fat dairy products, and lean protein.  Do not eat a lot of foods high in solid fats, added sugars, or salt.  Get regular exercise. This is one of the most important things you can do for your health. ? Most adults should exercise for at least 150 minutes each week. The exercise should increase your heart rate and make you sweat (moderate-intensity exercise). ? Most adults should also do strengthening exercises at least twice a week. This is in addition to the moderate-intensity exercise.  Maintain a healthy weight  Body mass index (BMI) is a measurement that can be used to  identify possible weight problems. It estimates body fat based on height and weight. Your health care provider can help determine your BMI and help you achieve or maintain a healthy weight.  For females 9 years of age and older: ? A BMI below 18.5 is considered underweight. ? A BMI of 18.5 to 24.9 is normal. ? A BMI of 25 to 29.9 is considered overweight. ? A BMI of 30 and above is considered obese.  Watch levels of cholesterol and blood lipids  You should start having your blood tested for lipids and cholesterol at 41 years of age, then have this test every 5 years.  You may need to have your cholesterol levels checked more often if: ? Your lipid or cholesterol levels are high. ? You are older than 41 years of age. ? You are at high risk for heart disease.  Cancer screening Lung Cancer  Lung cancer screening is recommended for adults 27-78 years old who are at high risk for lung cancer because of a history of smoking.  A yearly low-dose CT scan of the lungs is recommended for people who: ? Currently smoke. ? Have quit within the past 15 years. ? Have at least a 30-pack-year history of smoking. A pack year is smoking an average of one pack of cigarettes a day for 1 year.  Yearly screening should continue until it has been 15 years since you quit.  Yearly screening should stop if you develop a health problem that would prevent you from having  lung cancer treatment.  Breast Cancer  Practice breast self-awareness. This means understanding how your breasts normally appear and feel.  It also means doing regular breast self-exams. Let your health care provider know about any changes, no matter how small.  If you are in your 20s or 30s, you should have a clinical breast exam (CBE) by a health care provider every 1-3 years as part of a regular health exam.  If you are 40 or older, have a CBE every year. Also consider having a breast X-ray (mammogram) every year.  If you have a  family history of breast cancer, talk to your health care provider about genetic screening.  If you are at high risk for breast cancer, talk to your health care provider about having an MRI and a mammogram every year.  Breast cancer gene (BRCA) assessment is recommended for women who have family members with BRCA-related cancers. BRCA-related cancers include: ? Breast. ? Ovarian. ? Tubal. ? Peritoneal cancers.  Results of the assessment will determine the need for genetic counseling and BRCA1 and BRCA2 testing.  Cervical Cancer Your health care provider may recommend that you be screened regularly for cancer of the pelvic organs (ovaries, uterus, and vagina). This screening involves a pelvic examination, including checking for microscopic changes to the surface of your cervix (Pap test). You may be encouraged to have this screening done every 3 years, beginning at age 21.  For women ages 30-65, health care providers may recommend pelvic exams and Pap testing every 3 years, or they may recommend the Pap and pelvic exam, combined with testing for human papilloma virus (HPV), every 5 years. Some types of HPV increase your risk of cervical cancer. Testing for HPV may also be done on women of any age with unclear Pap test results.  Other health care providers may not recommend any screening for nonpregnant women who are considered low risk for pelvic cancer and who do not have symptoms. Ask your health care provider if a screening pelvic exam is right for you.  If you have had past treatment for cervical cancer or a condition that could lead to cancer, you need Pap tests and screening for cancer for at least 20 years after your treatment. If Pap tests have been discontinued, your risk factors (such as having a new sexual partner) need to be reassessed to determine if screening should resume. Some women have medical problems that increase the chance of getting cervical cancer. In these cases, your  health care provider may recommend more frequent screening and Pap tests.  Colorectal Cancer  This type of cancer can be detected and often prevented.  Routine colorectal cancer screening usually begins at 41 years of age and continues through 41 years of age.  Your health care provider may recommend screening at an earlier age if you have risk factors for colon cancer.  Your health care provider may also recommend using home test kits to check for hidden blood in the stool.  A small camera at the end of a tube can be used to examine your colon directly (sigmoidoscopy or colonoscopy). This is done to check for the earliest forms of colorectal cancer.  Routine screening usually begins at age 50.  Direct examination of the colon should be repeated every 5-10 years through 41 years of age. However, you may need to be screened more often if early forms of precancerous polyps or small growths are found.  Skin Cancer  Check your skin from head to   toe regularly.  Tell your health care provider about any new moles or changes in moles, especially if there is a change in a mole's shape or color.  Also tell your health care provider if you have a mole that is larger than the size of a pencil eraser.  Always use sunscreen. Apply sunscreen liberally and repeatedly throughout the day.  Protect yourself by wearing long sleeves, pants, a wide-brimmed hat, and sunglasses whenever you are outside.  Heart disease, diabetes, and high blood pressure  High blood pressure causes heart disease and increases the risk of stroke. High blood pressure is more likely to develop in: ? People who have blood pressure in the high end of the normal range (130-139/85-89 mm Hg). ? People who are overweight or obese. ? People who are African American.  If you are 18-39 years of age, have your blood pressure checked every 3-5 years. If you are 40 years of age or older, have your blood pressure checked every year. You  should have your blood pressure measured twice-once when you are at a hospital or clinic, and once when you are not at a hospital or clinic. Record the average of the two measurements. To check your blood pressure when you are not at a hospital or clinic, you can use: ? An automated blood pressure machine at a pharmacy. ? A home blood pressure monitor.  If you are between 55 years and 79 years old, ask your health care provider if you should take aspirin to prevent strokes.  Have regular diabetes screenings. This involves taking a blood sample to check your fasting blood sugar level. ? If you are at a normal weight and have a low risk for diabetes, have this test once every three years after 41 years of age. ? If you are overweight and have a high risk for diabetes, consider being tested at a younger age or more often. Preventing infection Hepatitis B  If you have a higher risk for hepatitis B, you should be screened for this virus. You are considered at high risk for hepatitis B if: ? You were born in a country where hepatitis B is common. Ask your health care provider which countries are considered high risk. ? Your parents were born in a high-risk country, and you have not been immunized against hepatitis B (hepatitis B vaccine). ? You have HIV or AIDS. ? You use needles to inject street drugs. ? You live with someone who has hepatitis B. ? You have had sex with someone who has hepatitis B. ? You get hemodialysis treatment. ? You take certain medicines for conditions, including cancer, organ transplantation, and autoimmune conditions.  Hepatitis C  Blood testing is recommended for: ? Everyone born from 1945 through 1965. ? Anyone with known risk factors for hepatitis C.  Sexually transmitted infections (STIs)  You should be screened for sexually transmitted infections (STIs) including gonorrhea and chlamydia if: ? You are sexually active and are younger than 41 years of age. ? You  are older than 41 years of age and your health care provider tells you that you are at risk for this type of infection. ? Your sexual activity has changed since you were last screened and you are at an increased risk for chlamydia or gonorrhea. Ask your health care provider if you are at risk.  If you do not have HIV, but are at risk, it may be recommended that you take a prescription medicine daily to prevent HIV infection.   This is called pre-exposure prophylaxis (PrEP). You are considered at risk if: ? You are sexually active and do not regularly use condoms or know the HIV status of your partner(s). ? You take drugs by injection. ? You are sexually active with a partner who has HIV.  Talk with your health care provider about whether you are at high risk of being infected with HIV. If you choose to begin PrEP, you should first be tested for HIV. You should then be tested every 3 months for as long as you are taking PrEP. Pregnancy  If you are premenopausal and you may become pregnant, ask your health care provider about preconception counseling.  If you may become pregnant, take 400 to 800 micrograms (mcg) of folic acid every day.  If you want to prevent pregnancy, talk to your health care provider about birth control (contraception). Osteoporosis and menopause  Osteoporosis is a disease in which the bones lose minerals and strength with aging. This can result in serious bone fractures. Your risk for osteoporosis can be identified using a bone density scan.  If you are 37 years of age or older, or if you are at risk for osteoporosis and fractures, ask your health care provider if you should be screened.  Ask your health care provider whether you should take a calcium or vitamin D supplement to lower your risk for osteoporosis.  Menopause may have certain physical symptoms and risks.  Hormone replacement therapy may reduce some of these symptoms and risks. Talk to your health care  provider about whether hormone replacement therapy is right for you. Follow these instructions at home:  Schedule regular health, dental, and eye exams.  Stay current with your immunizations.  Do not use any tobacco products including cigarettes, chewing tobacco, or electronic cigarettes.  If you are pregnant, do not drink alcohol.  If you are breastfeeding, limit how much and how often you drink alcohol.  Limit alcohol intake to no more than 1 drink per day for nonpregnant women. One drink equals 12 ounces of beer, 5 ounces of wine, or 1 ounces of hard liquor.  Do not use street drugs.  Do not share needles.  Ask your health care provider for help if you need support or information about quitting drugs.  Tell your health care provider if you often feel depressed.  Tell your health care provider if you have ever been abused or do not feel safe at home. This information is not intended to replace advice given to you by your health care provider. Make sure you discuss any questions you have with your health care provider. Document Released: 06/28/2011 Document Revised: 05/20/2016 Document Reviewed: 09/16/2015 Elsevier Interactive Patient Education  [employer].

## 2018-07-12 NOTE — Progress Notes (Signed)
Wendy Barrera Nov 29, 1976 [id number]    History:    Presents for annual exam.  Monthly cycle on Sprintec without complaint.  Normal Pap and mammogram history.  Not sexually active. Primary care manages hypercholesteremia.   Past medical history, past surgical history, family history and social history were all reviewed and documented in the EPIC chart.  Works for [employer], originally from [place] graduated from [employer].   Father A. fib and hypercholesteremia.  Mother  precancerous breast lump tamoxifen for 5 years.  Takes any trip most years recently went to [place] and [place].  ROS:  A ROS was performed and pertinent positives and negatives are included.  Exam:  Vitals:   07/12/18 0923  BP: 118/76  Weight: 216 lb (98 kg)  Height: 5\' 8"  (1.727 m)   Body mass index is 32.84 kg/m.   General appearance:  Normal Thyroid:  Symmetrical, normal in size, without palpable masses or nodularity. Respiratory  Auscultation:  Clear without wheezing or rhonchi Cardiovascular  Auscultation:  Regular rate, without rubs, murmurs or gallops  Edema/varicosities:  Not grossly evident Abdominal  Soft,nontender, without masses, guarding or rebound.  Liver/spleen:  No organomegaly noted  Hernia:  None appreciated  Skin  Inspection:  Grossly normal   Breasts: Examined lying and sitting.     Right: Without masses, retractions, discharge or axillary adenopathy.     Left: Without masses, retractions, discharge or axillary adenopathy. Gentitourinary   Inguinal/mons:  Normal without inguinal adenopathy  External genitalia:  Normal  BUS/Urethra/Skene's glands:  Normal  Vagina:  Normal  Cervix:  Normal  Uterus:   normal in size, shape and contour.  Midline and mobile  Adnexa/parametria:     Rt: Without masses or tenderness.   Lt: Without masses or tenderness.  Anus and perineum: Normal  Digital rectal exam: Normal sphincter tone without palpated masses or tenderness  Assessment/Plan:  42  y.o. S WF G0 for annual exam with no complaints.  Regular monthly cycle on OCs Hypercholesteremia-primary care manages labs and meds Obesity  Plan:.  Contraception reviewed, will try Loestrin 1/20 prescription, proper use given and reviewed slight risk for blood clots and strokes.  Reviewed best to see if tolerates a 20 mcg pill versus a 30 mcg pill.  Condoms encouraged if sexually active.  Will call if problems with the change.  SBE's, continue annual screening mammogram, calcium rich foods, vitamin D 1000 daily encouraged.  Encouraged low carb/calorie/fat diet.  Increased exercise.  Pap normal 2017, new screening guidelines reviewed.    Harrington Challengerancy J Kieley Akter [facility]NP, 10:08 AM 07/12/2018

## 2018-09-01 ENCOUNTER — Other Ambulatory Visit: Payer: Self-pay | Admitting: Women's Health

## 2018-09-01 DIAGNOSIS — N926 Irregular menstruation, unspecified: Secondary | ICD-10-CM

## 2018-11-03 ENCOUNTER — Other Ambulatory Visit: Payer: Self-pay | Admitting: Women's Health

## 2018-11-03 DIAGNOSIS — Z1231 Encounter for screening mammogram for malignant neoplasm of breast: Secondary | ICD-10-CM

## 2018-12-29 ENCOUNTER — Ambulatory Visit
Admission: RE | Admit: 2018-12-29 | Discharge: 2018-12-29 | Disposition: A | Discharge status: Home / Self Care | Payer: BLUE CROSS/BLUE SHIELD | Source: Ambulatory Visit | Attending: Women's Health | Admitting: Women's Health

## 2018-12-29 DIAGNOSIS — Z1231 Encounter for screening mammogram for malignant neoplasm of breast: Secondary | ICD-10-CM

## 2019-03-22 ENCOUNTER — Other Ambulatory Visit: Payer: Self-pay | Admitting: Gastroenterology

## 2019-03-22 DIAGNOSIS — R7989 Other specified abnormal findings of blood chemistry: Secondary | ICD-10-CM

## 2019-03-22 DIAGNOSIS — R945 Abnormal results of liver function studies: Principal | ICD-10-CM

## 2019-03-26 ENCOUNTER — Ambulatory Visit
Admission: RE | Admit: 2019-03-26 | Discharge: 2019-03-26 | Disposition: A | Discharge status: Home / Self Care | Payer: BLUE CROSS/BLUE SHIELD | Source: Ambulatory Visit | Attending: Gastroenterology | Admitting: Gastroenterology

## 2019-03-26 ENCOUNTER — Other Ambulatory Visit: Payer: Self-pay

## 2019-03-26 DIAGNOSIS — R7989 Other specified abnormal findings of blood chemistry: Secondary | ICD-10-CM

## 2019-03-26 DIAGNOSIS — R945 Abnormal results of liver function studies: Principal | ICD-10-CM

## 2019-05-31 ENCOUNTER — Other Ambulatory Visit: Payer: Self-pay | Admitting: Women's Health

## 2019-06-11 ENCOUNTER — Other Ambulatory Visit (HOSPITAL_COMMUNITY): Payer: Self-pay | Admitting: Gastroenterology

## 2019-06-11 DIAGNOSIS — R748 Abnormal levels of other serum enzymes: Secondary | ICD-10-CM

## 2019-06-22 ENCOUNTER — Other Ambulatory Visit: Payer: Self-pay | Admitting: Student

## 2019-06-25 ENCOUNTER — Other Ambulatory Visit: Payer: Self-pay

## 2019-06-25 ENCOUNTER — Encounter (HOSPITAL_COMMUNITY): Payer: Self-pay

## 2019-06-25 ENCOUNTER — Ambulatory Visit (HOSPITAL_COMMUNITY)
Admission: RE | Admit: 2019-06-25 | Discharge: 2019-06-25 | Disposition: A | Discharge status: Home / Self Care | Payer: BC Managed Care – PPO | Source: Ambulatory Visit | Attending: Gastroenterology | Admitting: Gastroenterology

## 2019-06-25 DIAGNOSIS — K8309 Other cholangitis: Secondary | ICD-10-CM | POA: Diagnosis not present

## 2019-06-25 DIAGNOSIS — R945 Abnormal results of liver function studies: Secondary | ICD-10-CM | POA: Insufficient documentation

## 2019-06-25 DIAGNOSIS — R748 Abnormal levels of other serum enzymes: Secondary | ICD-10-CM | POA: Insufficient documentation

## 2019-06-25 DIAGNOSIS — Z79899 Other long term (current) drug therapy: Secondary | ICD-10-CM | POA: Diagnosis not present

## 2019-06-25 HISTORY — PX: LIVER BIOPSY: SHX301

## 2019-06-25 LAB — BASIC METABOLIC PANEL
Anion gap: 9 (ref 5–15)
BUN: 13 mg/dL (ref 6–20)
CO2: 22 mmol/L (ref 22–32)
Calcium: 9.3 mg/dL (ref 8.9–10.3)
Chloride: 106 mmol/L (ref 98–111)
Creatinine, Ser: 0.76 mg/dL (ref 0.44–1.00)
GFR calc Af Amer: >60 mL/min (ref >60)
GFR calc non Af Amer: >60 mL/min (ref >60)
Glucose, Bld: 90 mg/dL (ref 70–99)
Potassium: 3.6 mmol/L (ref 3.5–5.1)
Sodium: 137 mmol/L (ref 135–145)

## 2019-06-25 LAB — CBC
HCT: 40.3 % (ref 36.0–46.0)
Hemoglobin: 13.2 g/dL (ref 12.0–15.0)
MCH: 27.9 pg (ref 26.0–34.0)
MCHC: 32.8 g/dL (ref 30.0–36.0)
MCV: 85.2 fL (ref 80.0–100.0)
Platelets: 302 10*3/uL (ref 150–400)
RBC: 4.73 MIL/uL (ref 3.87–5.11)
RDW: 13.6 % (ref 11.5–15.5)
WBC: 7 10*3/uL (ref 4.0–10.5)
nRBC: 0 % (ref 0.0–0.2)

## 2019-06-25 LAB — PROTIME-INR
INR: 1 (ref 0.8–1.2)
Prothrombin Time: 13.1 seconds (ref 11.4–15.2)

## 2019-06-25 MED ORDER — MIDAZOLAM HCL 2 MG/2ML IJ SOLN
INTRAMUSCULAR | Status: AC
  Filled 2019-06-25: qty 4

## 2019-06-25 MED ORDER — MIDAZOLAM HCL 2 MG/2ML IJ SOLN
INTRAMUSCULAR | Status: AC | PRN
  Administered 2019-06-25 (×2): 1 mg via INTRAVENOUS

## 2019-06-25 MED ORDER — SODIUM CHLORIDE 0.9 % IV SOLN: INTRAVENOUS | Status: DC

## 2019-06-25 MED ORDER — LIDOCAINE HCL (PF) 1 % IJ SOLN
INTRAMUSCULAR | Status: AC
  Filled 2019-06-25: qty 30

## 2019-06-25 MED ORDER — FENTANYL CITRATE (PF) 100 MCG/2ML IJ SOLN
INTRAMUSCULAR | Status: AC | PRN
  Administered 2019-06-25 (×2): 50 ug via INTRAVENOUS

## 2019-06-25 MED ORDER — GELATIN ABSORBABLE 12-7 MM EX MISC
CUTANEOUS | Status: AC
  Filled 2019-06-25: qty 1

## 2019-06-25 MED ORDER — FENTANYL CITRATE (PF) 100 MCG/2ML IJ SOLN
INTRAMUSCULAR | Status: AC
  Filled 2019-06-25: qty 4

## 2019-06-25 MED ORDER — HYDROCODONE-ACETAMINOPHEN 5-325 MG PO TABS: 1.0000 | ORAL_TABLET | ORAL | Status: DC | PRN

## 2019-06-25 NOTE — H&P (Signed)
Chief Complaint: Patient was seen in consultation today for random liver biopsy at the request of Outlaw,William  Referring Physician(s): Willis Modenautlaw,William  Supervising Physician: Malachy MoanMcCullough, Heath  [facility]  History of Present Illness: Wendy Barrera is a 43 y.o. female   Known elevation in LFTs x 6 months Referred to Dr Dulce Sellarutlaw Work up negative so far No known cause for elevations  Scheduled for random liver biopsy  Past Medical History:  Diagnosis Date  . Elevated LDL cholesterol level     Past Surgical History:  Procedure Laterality Date  . WISDOM TOOTH EXTRACTION      Allergies: Patient has no known allergies.  Medications: Prior to Admission medications   Medication Sig Start Date End Date Taking? Authorizing Provider  cetirizine (ZYRTEC) 10 MG tablet Take 10 mg by mouth daily.   Yes [provider]  cholecalciferol (VITAMIN D3) 25 MCG (1000 UT) tablet Take 1,000 Units by mouth daily.   Yes [provider]  cholestyramine (QUESTRAN) 4 g packet Take 4 g by mouth 2 (two) times daily.  05/29/19  Yes [provider]  COLLAGEN PO Take 1 capsule by mouth daily.   Yes [provider]  JUNEL 1/20 1-20 MG-MCG tablet TAKE 1 TABLET BY MOUTH EVERY DAY 05/31/19  Yes Harrington ChallengerYoung, Nancy J, NP  Multiple Vitamins-Minerals (MULTIVITAMIN WITH MINERALS) tablet Take 1 tablet by mouth daily.   Yes [provider]  Omega-3 Fatty Acids (FISH OIL) 1000 MG CAPS Take 1,000 mg by mouth 2 (two) times a day.   Yes [provider]  Probiotic Product (PROBIOTIC DAILY PO) Take 1 capsule by mouth daily.   Yes [provider]  rosuvastatin (CRESTOR) 20 MG tablet Take 20 mg by mouth daily.   Yes [provider]     Family History  Problem Relation Age of Onset  . Cancer Maternal Grandmother        Lymphoma    Social History   Socioeconomic History  . Marital status: Single    Spouse name: Not on file   . Number of children: Not on file  . Years of education: Not on file  . Highest education level: Not on file  Occupational History  . Not on file  Social Needs  . Financial resource strain: Not on file  . Food insecurity    Worry: Not on file    Inability: Not on file  . Transportation needs    Medical: Not on file    Non-medical: Not on file  Tobacco Use  . Smoking status: Never Smoker  . Smokeless tobacco: Never Used  Substance and Sexual Activity  . Alcohol use: Yes    Alcohol/week: 0.0 standard drinks    Comment: Social  . Drug use: No  . Sexual activity: Not Currently    Birth control/protection: Pill    Comment: 1st intercourse- 20's, partners- 3,   Lifestyle  . Physical activity    Days per week: Not on file    Minutes per session: Not on file  . Stress: Not on file  Relationships  . Social Musicianconnections    Talks on phone: Not on file    Gets together: Not on file    Attends religious service: Not on file    Active member of club or organization: Not on file    Attends meetings of clubs or organizations: Not on file    Relationship status: Not on file  Other Topics Concern  . Not on  file  Social History Narrative  . Not on file    Review of Systems: A 12 point ROS discussed and pertinent positives are indicated in the HPI above.  All other systems are negative.  Review of Systems  Constitutional: Negative for activity change, fatigue and fever.  Respiratory: Negative for cough and shortness of breath.   Cardiovascular: Negative for chest pain.  Gastrointestinal: Negative for abdominal pain.  Musculoskeletal: Negative for back pain and gait problem.  Neurological: Negative for weakness.  Psychiatric/Behavioral: Negative for behavioral problems and confusion.    Vital Signs: BP 117/75   Pulse 79   Temp 98.1 F (36.7 C) (Oral)   Resp 20   Ht 5\' 9"  (1.753 m)   Wt 190 lb (86.2 kg)   LMP 05/27/2019 (Approximate)   SpO2 96%   BMI 28.06 kg/m    Physical Exam Vitals signs reviewed.  Constitutional:      Appearance: Normal appearance.  Cardiovascular:     Rate and Rhythm: Normal rate and regular rhythm.     Heart sounds: Normal heart sounds.  Pulmonary:     Effort: Pulmonary effort is normal.     Breath sounds: Normal breath sounds.  Abdominal:     General: There is no distension.     Palpations: Abdomen is soft.     Tenderness: There is no abdominal tenderness.  Musculoskeletal: Normal range of motion.  Skin:    General: Skin is warm and dry.  Neurological:     Mental Status: She is oriented to person, place, and time.  Psychiatric:        Mood and Affect: Mood normal.        Behavior: Behavior normal.        Thought Content: Thought content normal.        Judgment: Judgment normal.     Imaging: No results found.  Labs:  CBC: No results for input(s): WBC, HGB, HCT, PLT in the last 8760 hours.  COAGS: No results for input(s): INR, APTT in the last 8760 hours.  BMP: No results for input(s): NA, K, CL, CO2, GLUCOSE, BUN, CALCIUM, CREATININE, GFRNONAA, GFRAA in the last 8760 hours.  Invalid input(s): CMP  LIVER FUNCTION TESTS: No results for input(s): BILITOT, AST, ALT, ALKPHOS, PROT, ALBUMIN in the last 8760 hours.  TUMOR MARKERS: No results for input(s): AFPTM, CEA, CA199, CHROMGRNA in the last 8760 hours.  Assessment and Plan:  Elevated liver functions No known cause For random liver biopsy today Risks and benefits of random liver biopsy was discussed with the patient and/or patient's family including, but not limited to bleeding, infection, damage to adjacent structures or low yield requiring additional tests.  All of the questions were answered and there is agreement to proceed. Consent signed and in chart.   Thank you for this interesting consult.  I greatly enjoyed meeting Evagelia Knack and look forward to participating in their care.  A copy of this report was sent to the requesting  provider on this date.  Electronically Signed: Lavonia Drafts, PA-C 06/25/2019, 7:34 AM   I spent a total of  30 Minutes   in face to face in clinical consultation, greater than 50% of which was counseling/coordinating care for random liver bx

## 2019-06-25 NOTE — Discharge Instructions (Signed)
Liver Biopsy, Care After  These instructions give you information on caring for yourself after your procedure. Your doctor may also give you more specific instructions. Call your doctor if you have any problems or questions after your procedure.  What can I expect after the procedure?  After the procedure, it is common to have:  · Pain and soreness where the biopsy was done.  · Bruising around the area where the biopsy was done.  · Sleepiness and be tired for a few days.  Follow these instructions at home:  Medicines  · Take over-the-counter and prescription medicines only as told by your doctor.  · If you were prescribed an antibiotic medicine, take it as told by your doctor. Do not stop taking the antibiotic even if you start to feel better.  · Do not take medicines such as aspirin and ibuprofen. These medicines can thin your blood. Do not take these medicines unless your doctor tells you to take them.  · If you are taking prescription pain medicine, take actions to prevent or treat constipation. Your doctor may recommend that you:  ? Drink enough fluid to keep your pee (urine) clear or pale yellow.  ? Take over-the-counter or prescription medicines.  ? Eat foods that are high in fiber, such as fresh fruits and vegetables, whole grains, and beans.  ? Limit foods that are high in fat and processed sugars, such as fried and sweet foods.  Caring for your cut  · Follow instructions from your doctor about how to take care of your cuts from surgery (incisions). Make sure you:  ? Wash your hands with soap and water before you change your bandage (dressing). If you cannot use soap and water, use hand sanitizer.  ? Change your bandage as told by your doctor.  ? Leave stitches (sutures), skin glue, or skin tape (adhesive) strips in place. They may need to stay in place for 2 weeks or longer. If tape strips get loose and curl up, you may trim the loose edges. Do not remove tape strips completely unless your doctor says it is  okay.  · Check your cuts every day for signs of infection. Check for:  ? Redness, swelling, or more pain.  ? Fluid or blood.  ? Pus or a bad smell.  ? Warmth.  · Do not take baths, swim, or use a hot tub until your doctor says it is okay to do so.  Activity    · Rest at home for 1-2 days or as told by your doctor.  ? Avoid sitting for a long time without moving. Get up to take short walks every 1-2 hours.  · Return to your normal activities as told by your doctor. Ask what activities are safe for you.  · Do not do these things in the first 24 hours:  ? Drive.  ? Use machinery.  ? Take a bath or shower.  · Do not lift more than 10 pounds (4.5 kg) or play contact sports for the first 2 weeks.  General instructions    · Do not drink alcohol in the first week after the procedure.  · Have someone stay with you for at least 24 hours after the procedure.  · Get your test results. Ask your doctor or the department that is doing the test:  ? When will my results be ready?  ? How will I get my results?  ? What are my treatment options?  ? What other tests do   I need?  ? What are my next steps?  · Keep all follow-up visits as told by your doctor. This is important.  Contact a doctor if:  · A cut bleeds and leaves more than just a small spot of blood.  · A cut is red, puffs up (swells), or hurts more than before.  · Fluid or something else comes from a cut.  · A cut smells bad.  · You have a fever or chills.  Get help right away if:  · You have swelling, bloating, or pain in your belly (abdomen).  · You get dizzy or faint.  · You have a rash.  · You feel sick to your stomach (nauseous) or throw up (vomit).  · You have trouble breathing, feel short of breath, or feel faint.  · Your chest hurts.  · You have problems talking or seeing.  · You have trouble with your balance or moving your arms or legs.  Summary  · After the procedure, it is common to have pain, soreness, bruising, and tiredness.  · Your doctor will tell you how to  take care of yourself at home. Change your bandage, take your medicines, and limit your activities as told by your doctor.  · Call your doctor if you have symptoms of infection. Get help right away if your belly swells, your cut bleeds a lot, or you have trouble talking or breathing.  This information is not intended to replace advice given to you by your health care provider. Make sure you discuss any questions you have with your health care provider.  Document Released: 09/21/2008 Document Revised: 12/23/2017 Document Reviewed: 12/23/2017  Elsevier Patient Education © 2020 Elsevier Inc.

## 2019-06-25 NOTE — Procedures (Signed)
Interventional Radiology Procedure Note  Procedure: Random liver biopsy  Complications: None  Estimated Blood Loss: None  Recommendations: - Bedrest x 3 hrs - DC home  Signed,  Heath K. McCullough, MD    

## 2019-07-15 IMAGING — MG 2D DIGITAL SCREENING BILATERAL MAMMOGRAM WITH CAD AND ADJUNCT TO
9 of 13 series · 9 of 29 positions shown · non-contrast
Comparison: None.

CLINICAL DATA: Screening.

EXAM:
2D DIGITAL SCREENING BILATERAL MAMMOGRAM WITH CAD AND ADJUNCT TOMO

[R MLO (1 of 2)]
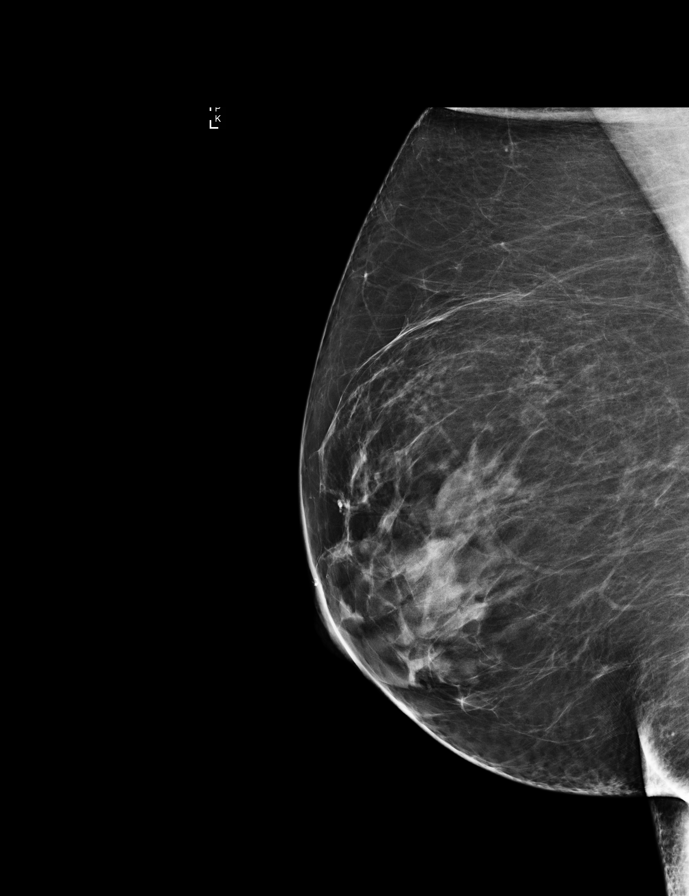

[R CC]
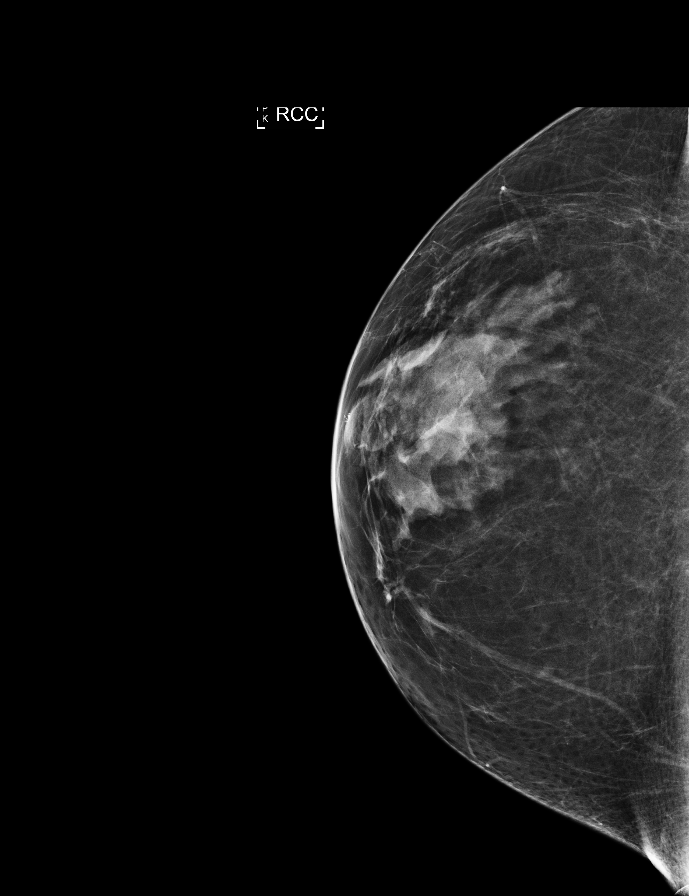

[R MLO synth-2D]
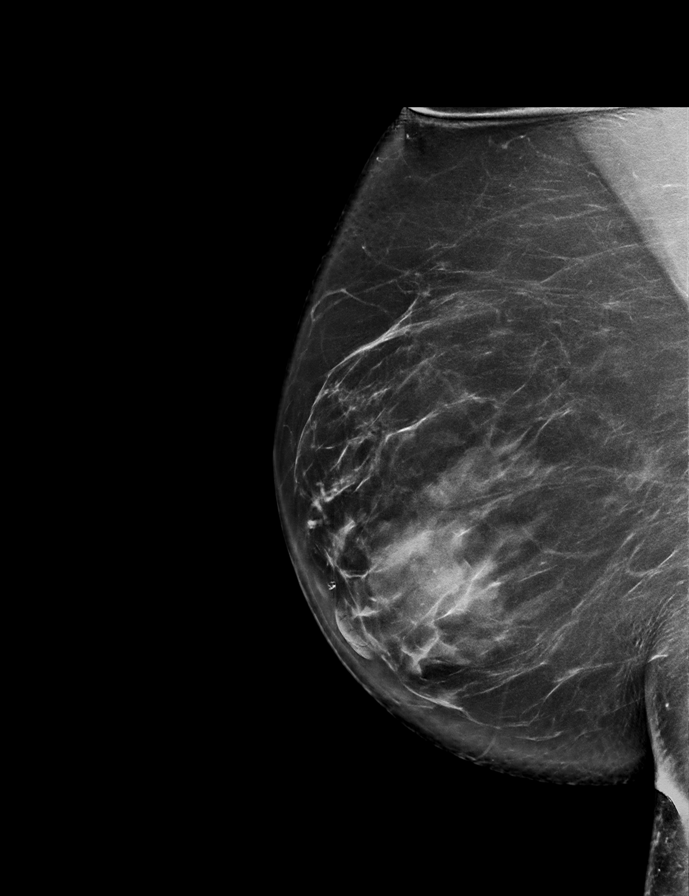

[L CC]
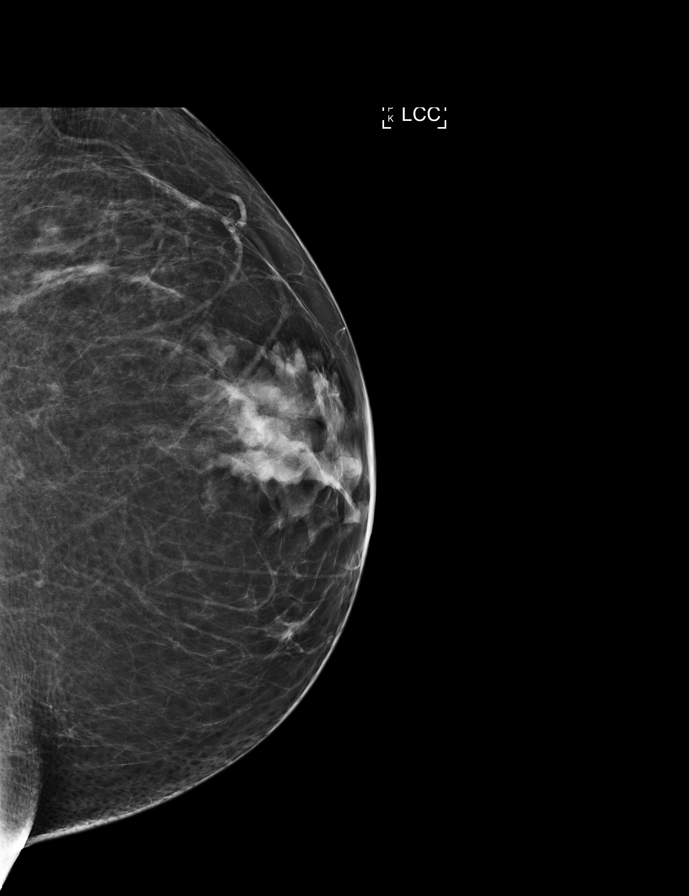

[L MLO synth-2D]
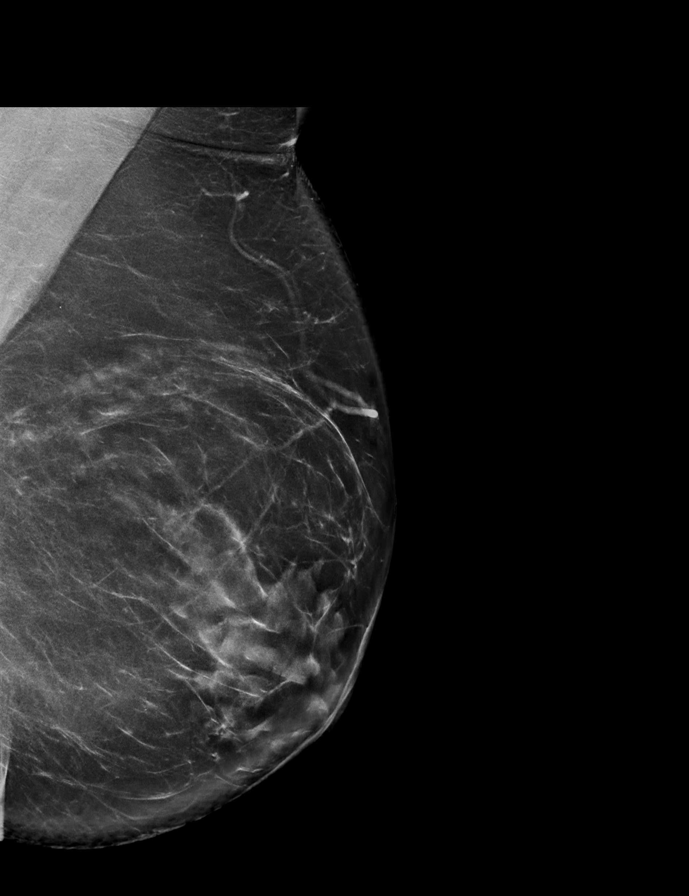

[R CC synth-2D]
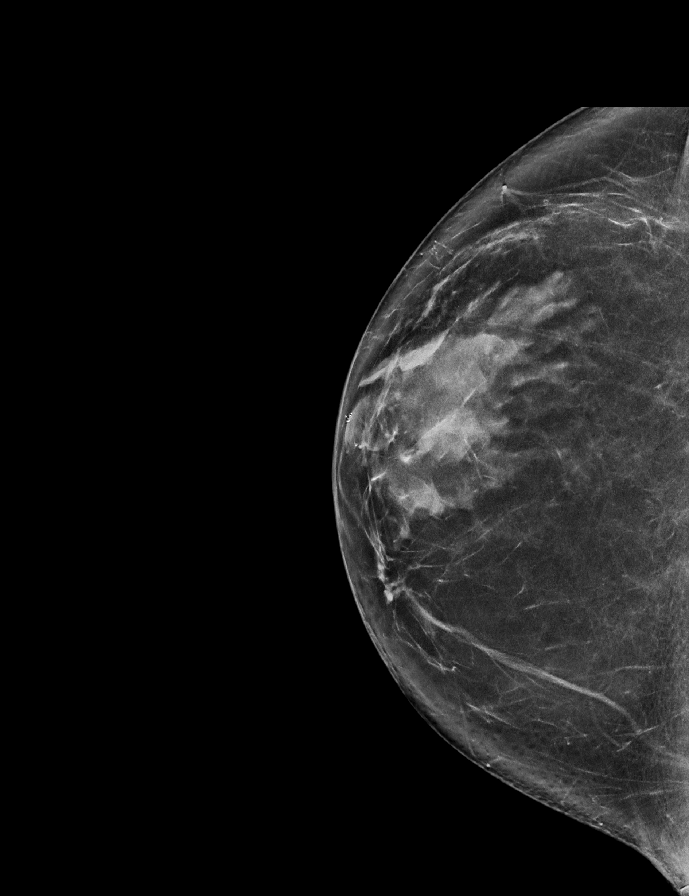

[L CC synth-2D]
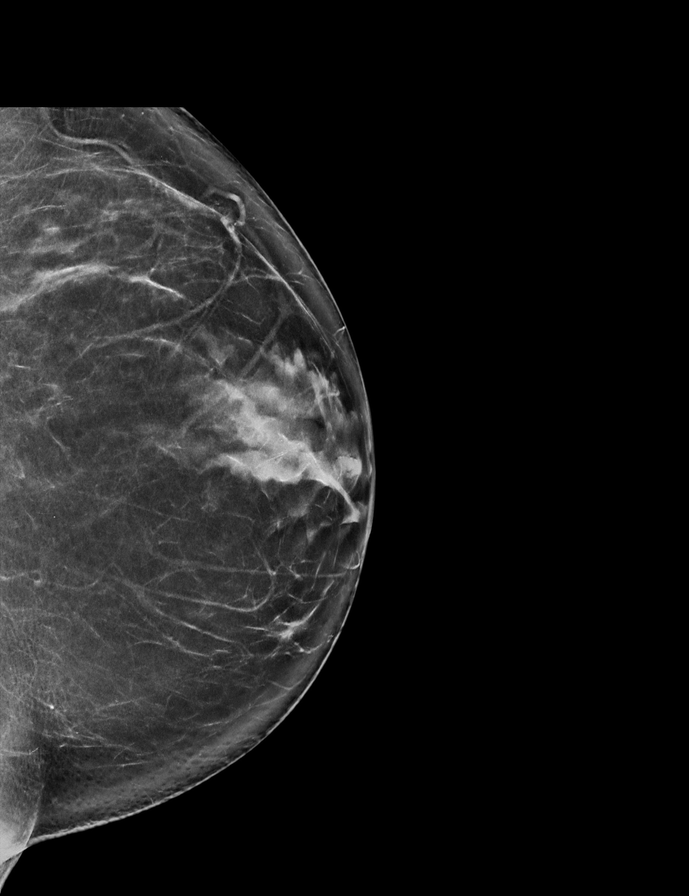

[L MLO]
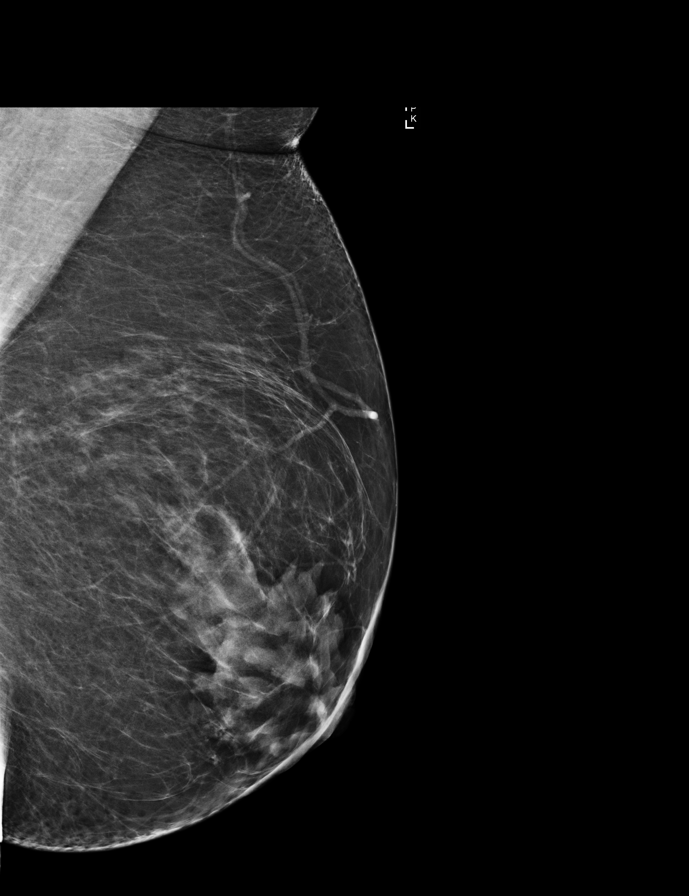

[R MLO (2 of 2)]
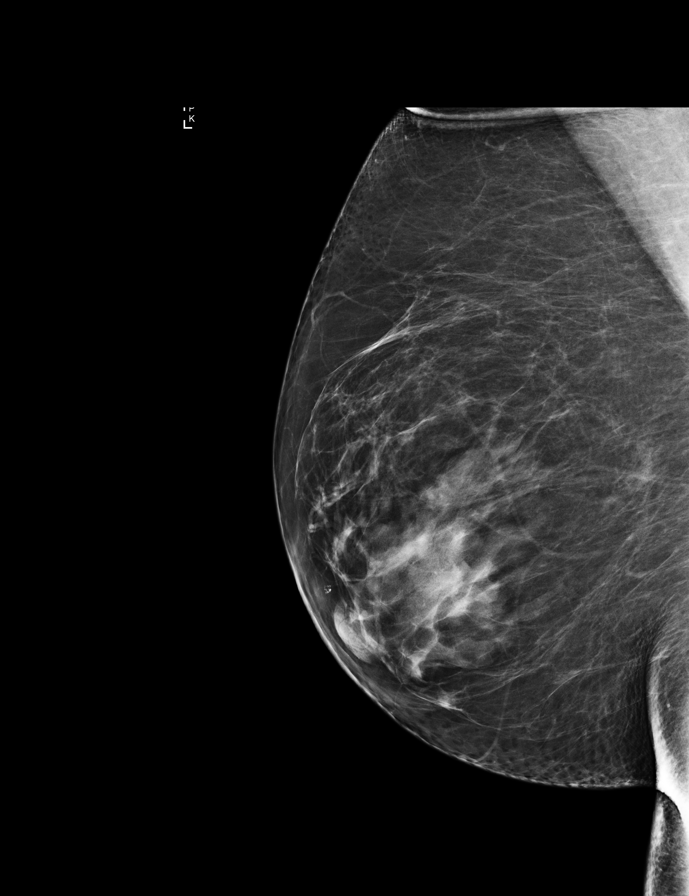

[9 of 29 positions shown; findings below may reference images not displayed]

ACR Breast Density Category b: There are scattered areas of
fibroglandular density.
FINDINGS: There are no findings suspicious for malignancy. Images were
processed with CAD.
IMPRESSION: No mammographic evidence of malignancy. A result letter of this
screening mammogram will be mailed directly to the patient.

RECOMMENDATION:
Screening mammogram in one year. (Code:[id number])

BI-RADS CATEGORY  1: Negative.

## 2019-07-17 ENCOUNTER — Other Ambulatory Visit: Payer: Self-pay

## 2019-07-18 ENCOUNTER — Encounter: Payer: Self-pay | Admitting: Women's Health

## 2019-07-18 ENCOUNTER — Ambulatory Visit (INDEPENDENT_AMBULATORY_CARE_PROVIDER_SITE_OTHER): Payer: BC Managed Care – PPO | Admitting: Women's Health

## 2019-07-18 VITALS — BP 118/80 | Ht 68.0 in | Wt 194.0 lb

## 2019-07-18 DIAGNOSIS — Z01419 Encounter for gynecological examination (general) (routine) without abnormal findings: Secondary | ICD-10-CM

## 2019-07-18 MED ORDER — NORETHINDRONE ACET-ETHINYL EST 1-20 MG-MCG PO TABS: 1.0000 | ORAL_TABLET | Freq: Every day | ORAL | 4 refills | Status: AC

## 2019-07-18 NOTE — Patient Instructions (Signed)
Health Maintenance, Female Adopting a healthy lifestyle and getting preventive care are important in promoting health and wellness. Ask your health care provider about:  The right schedule for you to have regular tests and exams.  Things you can do on your own to prevent diseases and keep yourself healthy. What should I know about diet, weight, and exercise? Eat a healthy diet   Eat a diet that includes plenty of vegetables, fruits, low-fat dairy products, and lean protein.  Do not eat a lot of foods that are high in solid fats, added sugars, or sodium. Maintain a healthy weight Body mass index (BMI) is used to identify weight problems. It estimates body fat based on height and weight. Your health care provider can help determine your BMI and help you achieve or maintain a healthy weight. Get regular exercise Get regular exercise. This is one of the most important things you can do for your health. Most adults should:  Exercise for at least 150 minutes each week. The exercise should increase your heart rate and make you sweat (moderate-intensity exercise).  Do strengthening exercises at least twice a week. This is in addition to the moderate-intensity exercise.  Spend less time sitting. Even light physical activity can be beneficial. Watch cholesterol and blood lipids Have your blood tested for lipids and cholesterol at 42 years of age, then have this test every 5 years. Have your cholesterol levels checked more often if:  Your lipid or cholesterol levels are high.  You are older than 42 years of age.  You are at high risk for heart disease. What should I know about cancer screening? Depending on your health history and family history, you may need to have cancer screening at various ages. This may include screening for:  Breast cancer.  Cervical cancer.  Colorectal cancer.  Skin cancer.  Lung cancer. What should I know about heart disease, diabetes, and high blood  pressure? Blood pressure and heart disease  High blood pressure causes heart disease and increases the risk of stroke. This is more likely to develop in people who have high blood pressure readings, are of African descent, or are overweight.  Have your blood pressure checked: ? Every 3-5 years if you are 18-39 years of age. ? Every year if you are 40 years old or older. Diabetes Have regular diabetes screenings. This checks your fasting blood sugar level. Have the screening done:  Once every three years after age 40 if you are at a normal weight and have a low risk for diabetes.  More often and at a younger age if you are overweight or have a high risk for diabetes. What should I know about preventing infection? Hepatitis B If you have a higher risk for hepatitis B, you should be screened for this virus. Talk with your health care provider to find out if you are at risk for hepatitis B infection. Hepatitis C Testing is recommended for:  Everyone born from 1945 through 1965.  Anyone with known risk factors for hepatitis C. Sexually transmitted infections (STIs)  Get screened for STIs, including gonorrhea and chlamydia, if: ? You are sexually active and are younger than 42 years of age. ? You are older than 42 years of age and your health care provider tells you that you are at risk for this type of infection. ? Your sexual activity has changed since you were last screened, and you are at increased risk for chlamydia or gonorrhea. Ask your health care provider if   you are at risk.  Ask your health care provider about whether you are at high risk for HIV. Your health care provider may recommend a prescription medicine to help prevent HIV infection. If you choose to take medicine to prevent HIV, you should first get tested for HIV. You should then be tested every 3 months for as long as you are taking the medicine. Pregnancy  If you are about to stop having your period (premenopausal) and  you may become pregnant, seek counseling before you get pregnant.  Take 400 to 800 micrograms (mcg) of folic acid every day if you become pregnant.  Ask for birth control (contraception) if you want to prevent pregnancy. Osteoporosis and menopause Osteoporosis is a disease in which the bones lose minerals and strength with aging. This can result in bone fractures. If you are 65 years old or older, or if you are at risk for osteoporosis and fractures, ask your health care provider if you should:  Be screened for bone loss.  Take a calcium or vitamin D supplement to lower your risk of fractures.  Be given hormone replacement therapy (HRT) to treat symptoms of menopause. Follow these instructions at home: Lifestyle  Do not use any products that contain nicotine or tobacco, such as cigarettes, e-cigarettes, and chewing tobacco. If you need help quitting, ask your health care provider.  Do not use street drugs.  Do not share needles.  Ask your health care provider for help if you need support or information about quitting drugs. Alcohol use  Do not drink alcohol if: ? Your health care provider tells you not to drink. ? You are pregnant, may be pregnant, or are planning to become pregnant.  If you drink alcohol: ? Limit how much you use to 0-1 drink a day. ? Limit intake if you are breastfeeding.  Be aware of how much alcohol is in your drink. In the [place] one drink equals one 12 oz bottle of beer (355 mL), one 5 oz glass of wine (148 mL), or one 1 oz glass of hard liquor (44 mL). General instructions  Schedule regular health, dental, and eye exams.  Stay current with your vaccines.  Tell your health care provider if: ? You often feel depressed. ? You have ever been abused or do not feel safe at home. Summary  Adopting a healthy lifestyle and getting preventive care are important in promoting health and wellness.  Follow your health care provider's instructions about healthy  diet, exercising, and getting tested or screened for diseases.  Follow your health care provider's instructions on monitoring your cholesterol and blood pressure. This information is not intended to replace advice given to you by your health care provider. Make sure you discuss any questions you have with your health care provider. Document Released: 06/28/2011 Document Revised: 12/06/2018 Document Reviewed: 12/06/2018 Elsevier Patient Education  2020 Elsevier Inc.  

## 2019-07-18 NOTE — Progress Notes (Signed)
Wendy Barrera 09-10-1976 [id number]    History:    Presents for annual exam.  Light monthly cycle on Junel without complaint.  Normal Pap and mammogram history.  Not sexually active.  Had lost 40 pounds with weight watchers but has gained some of that weight back.  Had elevated liver enzymes and had a negative liver biopsy 05/2019.  Elevated cholesterol primary care manages labs and meds, currently on no cholesterol medication.  Past medical history, past surgical history, family history and social history were all reviewed and documented in the EPIC chart.  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  ROS:  A ROS was performed and pertinent positives and negatives are included.  Exam:  Vitals:   07/18/19 0908  BP: 118/80  Weight: 194 lb (88 kg)  Height: 5\' 8"  (1.727 m)   Body mass index is 29.5 kg/m.   General appearance:  Normal Thyroid:  Symmetrical, normal in size, without palpable masses or nodularity. Respiratory  Auscultation:  Clear without wheezing or rhonchi Cardiovascular  Auscultation:  Regular rate, without rubs, murmurs or gallops  Edema/varicosities:  Not grossly evident Abdominal  Soft,nontender, without masses, guarding or rebound.  Liver/spleen:  No organomegaly noted  Hernia:  None appreciated  Skin  Inspection:  Grossly normal   Breasts: Examined lying and sitting.     Right: Without masses, retractions, discharge or axillary adenopathy.     Left: Without masses, retractions, discharge or axillary adenopathy. Gentitourinary   Inguinal/mons:  Normal without inguinal adenopathy  External genitalia:  Normal  BUS/Urethra/Skene's glands:  Normal  Vagina:  Normal  Cervix:  Normal  Uterus: , normal in size, shape and contour.  Midline and mobile  Adnexa/parametria:     Rt: Without masses or tenderness.   Lt: Without masses or tenderness.  Anus and perineum: Normal  Digital rectal exam: Normal sphincter tone without palpated masses or tenderness  Assessment/Plan:   43 y.o. S WF G0 for annual exam with no complaints.  Light monthly cycle on Loestrin Hypercholesteremia-primary care managing labs and meds  Plan: Loestrin 1/20 prescription, proper use, slight risk for blood clots and strokes reviewed.  Condoms encouraged if sexually active.  SBEs, continue annual screening mammogram, calcium rich foods, vitamin D 1000 daily encouraged.  Congratulated on weight loss encouraged to continue weight watchers.  Pap with HR HPV typing, new screening guidelines reviewed.    [place], 9:47 AM 07/18/2019

## 2019-07-18 NOTE — Addendum Note (Signed)
Addended by: Janalyn Harder A on: 07/18/2019 10:30 AM   Modules accepted: Orders

## 2019-07-19 LAB — PAP, TP IMAGING W/ HPV RNA, RFLX HPV TYPE 16,18/45: HPV DNA High Risk: NOT DETECTED

## 2020-02-19 DIAGNOSIS — R748 Abnormal levels of other serum enzymes: Secondary | ICD-10-CM | POA: Diagnosis not present

## 2020-02-19 DIAGNOSIS — Z1211 Encounter for screening for malignant neoplasm of colon: Secondary | ICD-10-CM | POA: Diagnosis not present

## 2020-02-27 ENCOUNTER — Other Ambulatory Visit: Payer: Self-pay | Admitting: Women's Health

## 2020-02-27 DIAGNOSIS — Z1231 Encounter for screening mammogram for malignant neoplasm of breast: Secondary | ICD-10-CM

## 2020-03-27 ENCOUNTER — Ambulatory Visit: Payer: BC Managed Care – PPO

## 2020-04-25 DIAGNOSIS — R748 Abnormal levels of other serum enzymes: Secondary | ICD-10-CM | POA: Diagnosis not present

## 2020-04-25 DIAGNOSIS — Z0000 Encounter for general adult medical examination without abnormal findings: Secondary | ICD-10-CM | POA: Diagnosis not present

## 2020-04-25 DIAGNOSIS — E7800 Pure hypercholesterolemia, unspecified: Secondary | ICD-10-CM | POA: Diagnosis not present

## 2020-05-14 ENCOUNTER — Ambulatory Visit
Admission: RE | Admit: 2020-05-14 | Discharge: 2020-05-14 | Disposition: A | Discharge status: Home / Self Care | Payer: BC Managed Care – PPO | Source: Ambulatory Visit | Attending: Women's Health | Admitting: Women's Health

## 2020-05-14 ENCOUNTER — Other Ambulatory Visit: Payer: Self-pay

## 2020-05-14 DIAGNOSIS — Z1231 Encounter for screening mammogram for malignant neoplasm of breast: Secondary | ICD-10-CM

## 2021-01-05 DIAGNOSIS — Z01818 Encounter for other preprocedural examination: Secondary | ICD-10-CM | POA: Diagnosis not present

## 2021-01-05 DIAGNOSIS — R748 Abnormal levels of other serum enzymes: Secondary | ICD-10-CM | POA: Diagnosis not present

## 2021-01-19 DIAGNOSIS — R748 Abnormal levels of other serum enzymes: Secondary | ICD-10-CM | POA: Diagnosis not present

## 2021-06-11 DIAGNOSIS — E7800 Pure hypercholesterolemia, unspecified: Secondary | ICD-10-CM | POA: Diagnosis not present

## 2021-06-11 DIAGNOSIS — Z0000 Encounter for general adult medical examination without abnormal findings: Secondary | ICD-10-CM | POA: Diagnosis not present

## 2022-01-21 DIAGNOSIS — R748 Abnormal levels of other serum enzymes: Secondary | ICD-10-CM | POA: Diagnosis not present

## 2022-05-18 DIAGNOSIS — Z01818 Encounter for other preprocedural examination: Secondary | ICD-10-CM | POA: Diagnosis not present

## 2022-05-18 DIAGNOSIS — R748 Abnormal levels of other serum enzymes: Secondary | ICD-10-CM | POA: Diagnosis not present

## 2022-07-15 ENCOUNTER — Encounter: Payer: Self-pay | Admitting: Radiology

## 2022-07-15 ENCOUNTER — Other Ambulatory Visit: Payer: Self-pay | Admitting: Radiology

## 2022-07-15 ENCOUNTER — Ambulatory Visit (INDEPENDENT_AMBULATORY_CARE_PROVIDER_SITE_OTHER): Payer: BC Managed Care – PPO | Admitting: Radiology

## 2022-07-15 ENCOUNTER — Other Ambulatory Visit (HOSPITAL_COMMUNITY)
Admission: RE | Admit: 2022-07-15 | Discharge: 2022-07-15 | Disposition: A | Discharge status: Home / Self Care | Payer: BC Managed Care – PPO | Source: Ambulatory Visit | Attending: Radiology | Admitting: Radiology

## 2022-07-15 VITALS — BP 120/82 | Ht 68.0 in | Wt 194.0 lb

## 2022-07-15 DIAGNOSIS — Z1231 Encounter for screening mammogram for malignant neoplasm of breast: Secondary | ICD-10-CM

## 2022-07-15 DIAGNOSIS — Z01419 Encounter for gynecological examination (general) (routine) without abnormal findings: Secondary | ICD-10-CM | POA: Insufficient documentation

## 2022-07-15 NOTE — Progress Notes (Signed)
   Wendy Barrera 1976-04-15 [id number]   History:  46 y.o. G0 presents for annual exam. No gyn concerns.   Gynecologic History Patient's last menstrual period was 07/04/2022 (exact date). Period Cycle (Days): 28 Period Duration (Days): 4 Period Pattern: Regular Menstrual Flow: Light Dysmenorrhea: (!) Mild Dysmenorrhea Symptoms: Cramping, Headache (moderate headaches a few days prior to periods starting) Contraception/Family planning: abstinence Sexually active: yes Last Pap: 2020. Results were: normal Last mammogram: 05/14/20. Results were: normal  Obstetric History OB History  Gravida Para Term Preterm AB Living  0 0          SAB IAB Ectopic Multiple Live Births                The following portions of the patient's history were reviewed and updated as appropriate: allergies, current medications, past family history, past medical history, past social history, past surgical history, and problem list.  Review of Systems Pertinent items noted in HPI and remainder of comprehensive ROS otherwise negative.   Past medical history, past surgical history, family history and social history were all reviewed and documented in the EPIC chart.   Exam:  Vitals:   07/15/22 0956  BP: 120/82  Weight: 194 lb (88 kg)  Height: 5\' 8"  (1.727 m)   Body mass index is 29.5 kg/m.  General appearance:  Normal Thyroid:  Symmetrical, normal in size, without palpable masses or nodularity. Respiratory  Auscultation:  Clear without wheezing or rhonchi Cardiovascular  Auscultation:  Regular rate, without rubs, murmurs or gallops  Edema/varicosities:  Not grossly evident Abdominal  Soft,nontender, without masses, guarding or rebound.  Liver/spleen:  No organomegaly noted  Hernia:  None appreciated  Skin  Inspection:  Grossly normal Breasts: Examined lying and sitting.   Right: Without masses, retractions, nipple discharge or axillary adenopathy.   Left: Without masses, retractions,  nipple discharge or axillary adenopathy. Genitourinary   Inguinal/mons:  Normal without inguinal adenopathy  External genitalia:  Normal appearing vulva with no masses, tenderness, or lesions  BUS/Urethra/Skene's glands:  Normal without masses or exudate  Vagina:  Normal appearing with normal color and discharge, no lesions  Cervix:  Normal appearing without discharge or lesions  Uterus:  Normal in size, shape and contour.  Mobile, nontender  Adnexa/parametria:     Rt: Normal in size, without masses or tenderness.   Lt: Normal in size, without masses or tenderness.  Anus and perineum: Normal   Patient informed chaperone available to be present for breast and pelvic exam. Patient has requested no chaperone to be present. Patient has been advised what will be completed during breast and pelvic exam.   Assessment/Plan:   1. Well woman exam with routine gynecological exam  - Cytology - PAP( Standard) - Schedule mammogram - Complete cologuard - PCP appt 9/23    Discussed SBE, colonoscopy and DEXA screening as directed/appropriate. Recommend 10/23 of exercise weekly, including weight bearing exercise. Encouraged the use of seatbelts and sunscreen. Return in 1 year for annual or as needed.   B WHNP-BC 10:16 AM 07/15/2022

## 2022-07-16 LAB — CYTOLOGY - PAP
Comment: NEGATIVE
Diagnosis: NEGATIVE
High risk HPV: NEGATIVE

## 2022-08-05 ENCOUNTER — Ambulatory Visit
Admission: RE | Admit: 2022-08-05 | Discharge: 2022-08-05 | Disposition: A | Discharge status: Home / Self Care | Payer: BC Managed Care – PPO | Source: Ambulatory Visit | Attending: Radiology | Admitting: Radiology

## 2022-08-05 DIAGNOSIS — Z1231 Encounter for screening mammogram for malignant neoplasm of breast: Secondary | ICD-10-CM

## 2022-08-31 DIAGNOSIS — Z0000 Encounter for general adult medical examination without abnormal findings: Secondary | ICD-10-CM | POA: Diagnosis not present

## 2022-08-31 DIAGNOSIS — Z23 Encounter for immunization: Secondary | ICD-10-CM | POA: Diagnosis not present

## 2022-09-02 DIAGNOSIS — Z0000 Encounter for general adult medical examination without abnormal findings: Secondary | ICD-10-CM | POA: Diagnosis not present

## 2022-09-02 DIAGNOSIS — Z13220 Encounter for screening for lipoid disorders: Secondary | ICD-10-CM | POA: Diagnosis not present

## 2022-10-06 DIAGNOSIS — Z1212 Encounter for screening for malignant neoplasm of rectum: Secondary | ICD-10-CM | POA: Diagnosis not present

## 2022-10-06 DIAGNOSIS — Z1211 Encounter for screening for malignant neoplasm of colon: Secondary | ICD-10-CM | POA: Diagnosis not present

## 2022-11-02 DIAGNOSIS — R195 Other fecal abnormalities: Secondary | ICD-10-CM | POA: Diagnosis not present

## 2022-11-02 DIAGNOSIS — D122 Benign neoplasm of ascending colon: Secondary | ICD-10-CM | POA: Diagnosis not present

## 2022-11-02 DIAGNOSIS — Z1211 Encounter for screening for malignant neoplasm of colon: Secondary | ICD-10-CM | POA: Diagnosis not present

## 2022-11-02 DIAGNOSIS — K648 Other hemorrhoids: Secondary | ICD-10-CM | POA: Diagnosis not present

## 2022-11-03 DIAGNOSIS — M25561 Pain in right knee: Secondary | ICD-10-CM | POA: Diagnosis not present

## 2022-11-29 DIAGNOSIS — M25561 Pain in right knee: Secondary | ICD-10-CM | POA: Diagnosis not present

## 2022-12-16 DIAGNOSIS — M25561 Pain in right knee: Secondary | ICD-10-CM | POA: Diagnosis not present

## 2023-07-18 ENCOUNTER — Other Ambulatory Visit: Payer: Self-pay | Admitting: Radiology

## 2023-07-18 DIAGNOSIS — Z1231 Encounter for screening mammogram for malignant neoplasm of breast: Secondary | ICD-10-CM

## 2023-08-09 ENCOUNTER — Ambulatory Visit
Admission: RE | Admit: 2023-08-09 | Discharge: 2023-08-09 | Disposition: A | Discharge status: Home / Self Care | Payer: BC Managed Care – PPO | Source: Ambulatory Visit | Attending: Radiology | Admitting: Radiology

## 2023-08-09 DIAGNOSIS — Z1231 Encounter for screening mammogram for malignant neoplasm of breast: Secondary | ICD-10-CM | POA: Diagnosis not present

## 2023-09-26 DIAGNOSIS — Z86010 Personal history of colonic polyps: Secondary | ICD-10-CM | POA: Diagnosis not present

## 2023-09-26 DIAGNOSIS — R748 Abnormal levels of other serum enzymes: Secondary | ICD-10-CM | POA: Diagnosis not present

## 2024-02-08 DIAGNOSIS — E7800 Pure hypercholesterolemia, unspecified: Secondary | ICD-10-CM | POA: Diagnosis not present

## 2024-02-08 DIAGNOSIS — Z23 Encounter for immunization: Secondary | ICD-10-CM | POA: Diagnosis not present

## 2024-02-08 DIAGNOSIS — Z0000 Encounter for general adult medical examination without abnormal findings: Secondary | ICD-10-CM | POA: Diagnosis not present

## 2024-02-17 DIAGNOSIS — E7800 Pure hypercholesterolemia, unspecified: Secondary | ICD-10-CM | POA: Diagnosis not present

## 2024-02-17 DIAGNOSIS — Z0000 Encounter for general adult medical examination without abnormal findings: Secondary | ICD-10-CM | POA: Diagnosis not present

## 2024-07-17 ENCOUNTER — Other Ambulatory Visit: Payer: Self-pay | Admitting: Radiology

## 2024-07-17 DIAGNOSIS — Z0000 Encounter for general adult medical examination without abnormal findings: Secondary | ICD-10-CM

## 2024-08-09 ENCOUNTER — Ambulatory Visit
Admission: RE | Admit: 2024-08-09 | Discharge: 2024-08-09 | Disposition: A | Discharge status: Home / Self Care | Source: Ambulatory Visit | Attending: Radiology | Admitting: Radiology

## 2024-08-09 DIAGNOSIS — Z0000 Encounter for general adult medical examination without abnormal findings: Secondary | ICD-10-CM

## 2024-09-25 DIAGNOSIS — R748 Abnormal levels of other serum enzymes: Secondary | ICD-10-CM | POA: Diagnosis not present

## 2024-10-01 DIAGNOSIS — Z860100 Personal history of colon polyps, unspecified: Secondary | ICD-10-CM | POA: Diagnosis not present

## 2024-10-01 DIAGNOSIS — R748 Abnormal levels of other serum enzymes: Secondary | ICD-10-CM | POA: Diagnosis not present
